# Patient Record
Sex: Male | Born: 1980 | Race: White | Hispanic: No | Marital: Married | Smoking: Former smoker
Health system: Southern US, Community
[De-identification: ages and names within clinical notes are randomized; demographics above are authoritative.]

## PROBLEM LIST (undated history)

## (undated) DIAGNOSIS — K429 Umbilical hernia without obstruction or gangrene: Secondary | ICD-10-CM

## (undated) DIAGNOSIS — F988 Other specified behavioral and emotional disorders with onset usually occurring in childhood and adolescence: Secondary | ICD-10-CM

## (undated) DIAGNOSIS — S60429A Blister (nonthermal) of unspecified finger, initial encounter: Secondary | ICD-10-CM

## (undated) DIAGNOSIS — H00019 Hordeolum externum unspecified eye, unspecified eyelid: Secondary | ICD-10-CM

## (undated) DIAGNOSIS — K219 Gastro-esophageal reflux disease without esophagitis: Secondary | ICD-10-CM

## (undated) HISTORY — PX: DENTAL SURGERY: SHX609

---

## 2003-02-24 ENCOUNTER — Encounter: Admission: RE | Admit: 2003-02-24 | Discharge: 2003-02-24 | Payer: Self-pay | Admitting: *Deleted

## 2004-08-25 ENCOUNTER — Ambulatory Visit: Payer: Self-pay | Admitting: Internal Medicine

## 2008-03-07 ENCOUNTER — Emergency Department (HOSPITAL_BASED_OUTPATIENT_CLINIC_OR_DEPARTMENT_OTHER): Admission: EM | Admit: 2008-03-07 | Discharge: 2008-03-07 | Payer: Self-pay | Admitting: Emergency Medicine

## 2010-05-31 IMAGING — CR DG KNEE COMPLETE 4+V*R*
4 series · 4 of 4 positions shown · non-contrast
Comparison: None available

CLINICAL DATA: Knee injury, medial pain

RIGHT KNEE - COMPLETE 4+ VIEW

[t knee ap right]
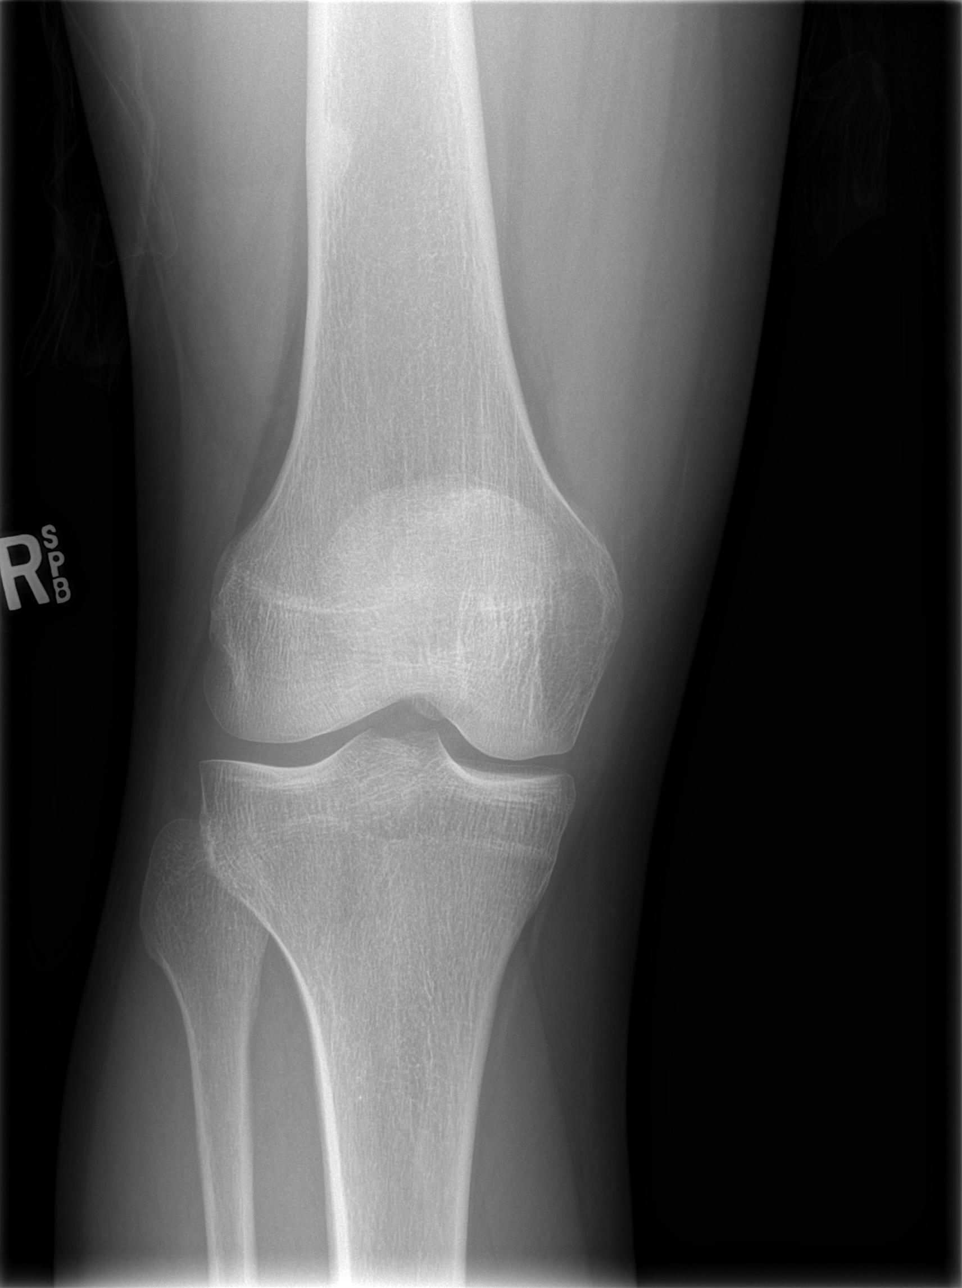

[t knee oblique right (1 of 2)]
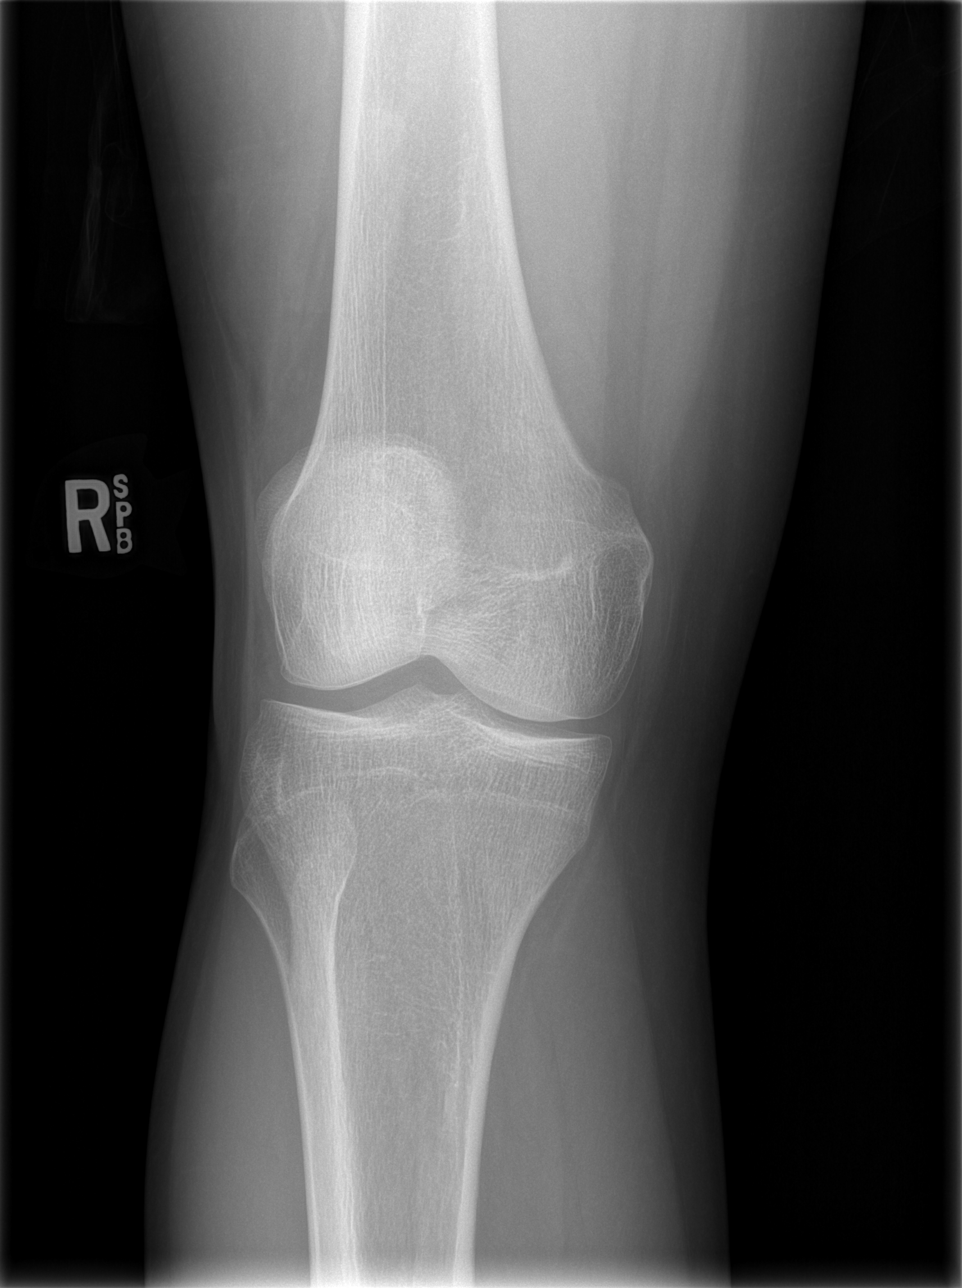

[t knee oblique right (2 of 2)]
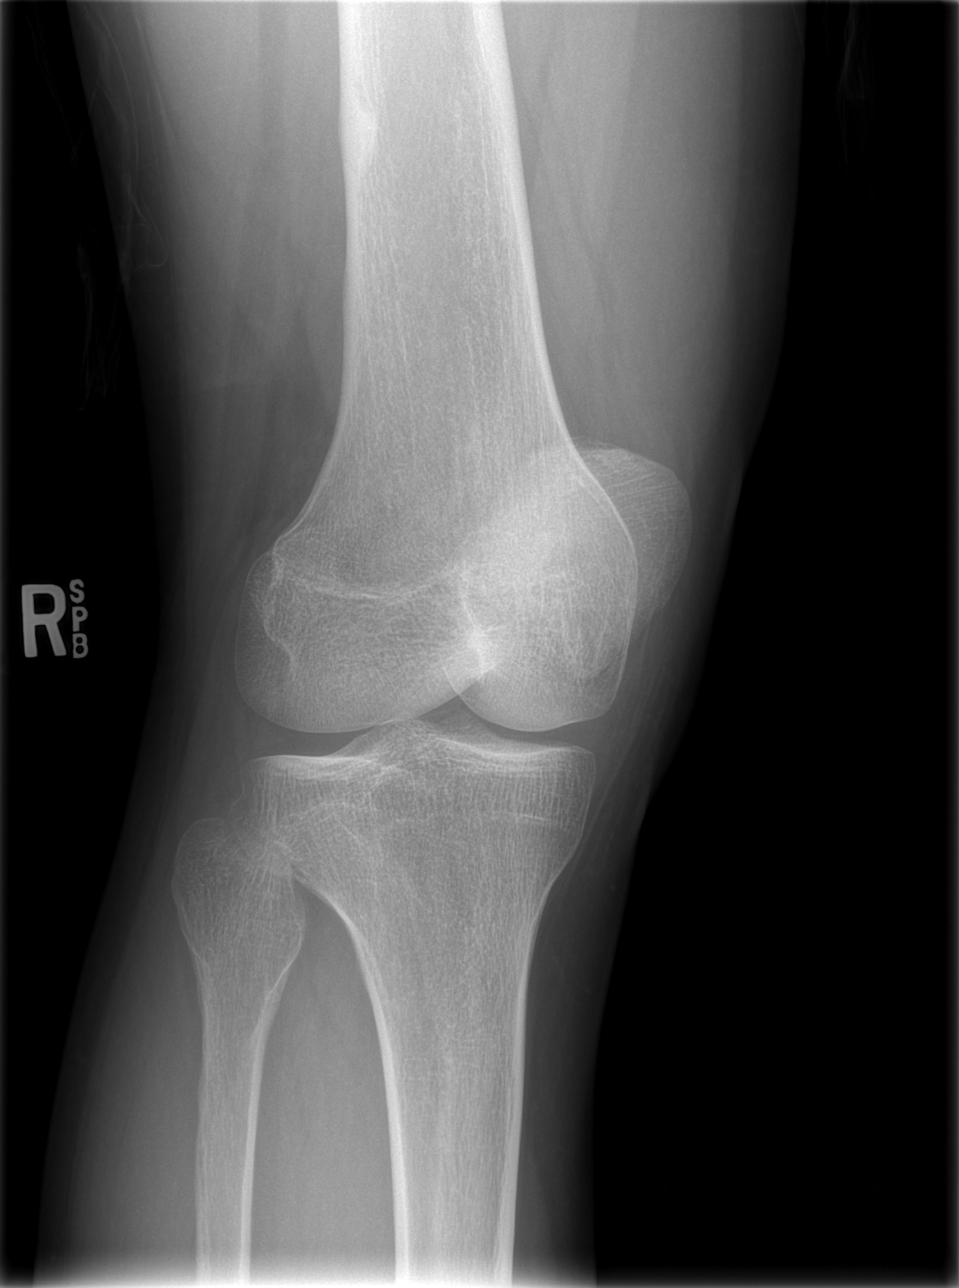

[t knee lat right]
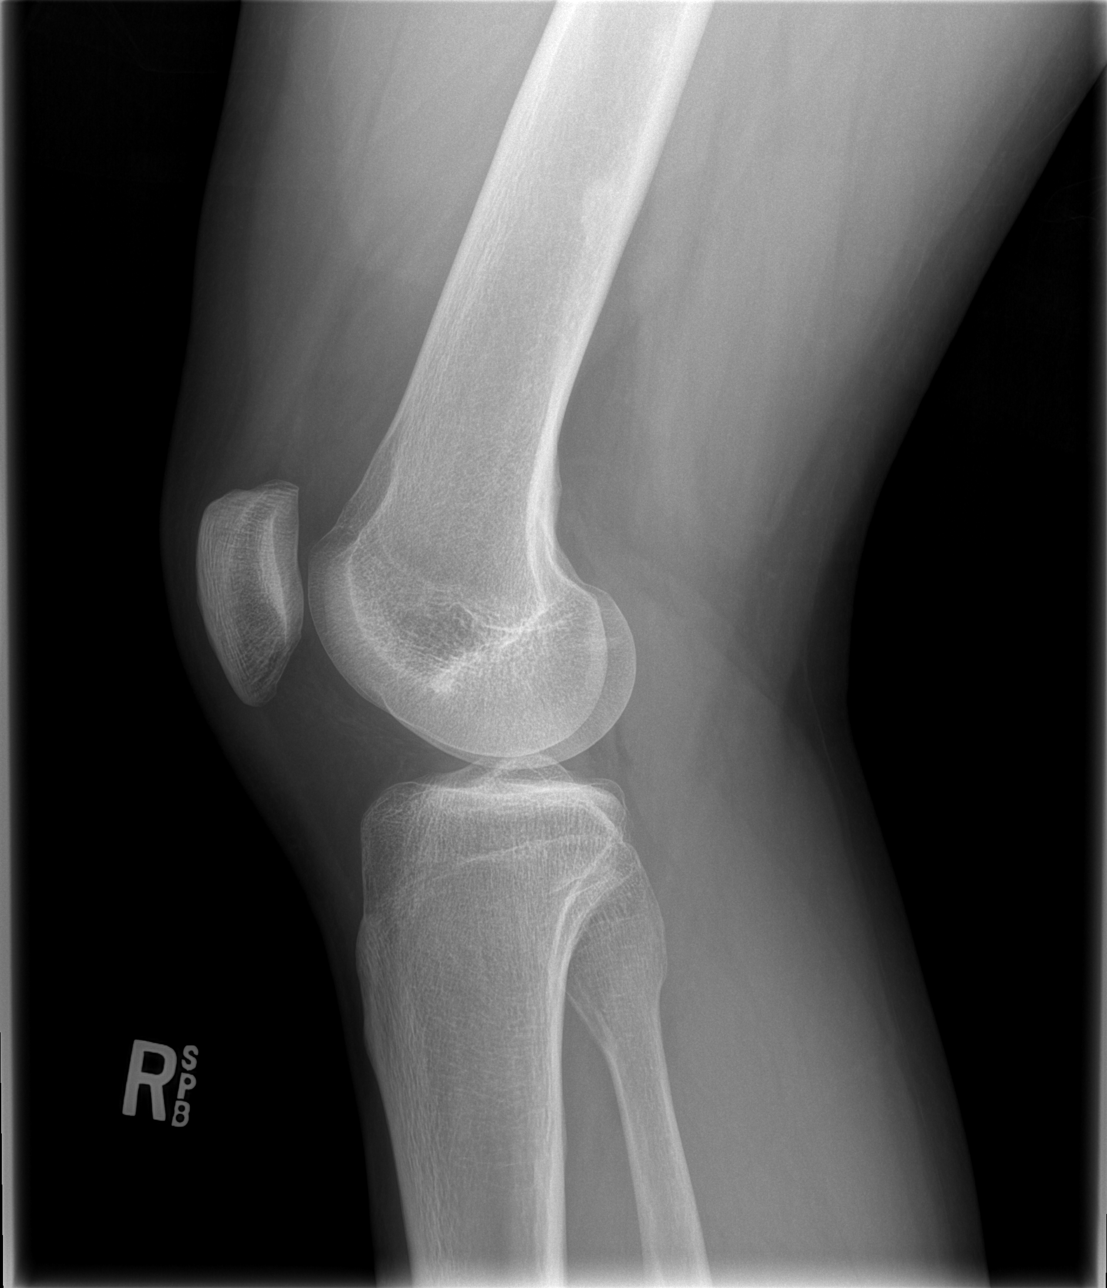

[4 of 4 positions shown; findings below may reference images not displayed]

FINDINGS: There is no acute bony or joint abnormality.  Small, old
fibrous cortical defect in the distal femur noted.  No effusion.
IMPRESSION: No acute finding.

## 2011-11-26 ENCOUNTER — Encounter (HOSPITAL_BASED_OUTPATIENT_CLINIC_OR_DEPARTMENT_OTHER): Payer: Self-pay | Admitting: *Deleted

## 2011-11-26 ENCOUNTER — Emergency Department (HOSPITAL_BASED_OUTPATIENT_CLINIC_OR_DEPARTMENT_OTHER)
Admission: EM | Admit: 2011-11-26 | Discharge: 2011-11-26 | Disposition: A | Discharge status: Home / Self Care | Payer: Self-pay | Attending: Emergency Medicine | Admitting: Emergency Medicine

## 2011-11-26 ENCOUNTER — Emergency Department (INDEPENDENT_AMBULATORY_CARE_PROVIDER_SITE_OTHER): Payer: Self-pay

## 2011-11-26 DIAGNOSIS — M25469 Effusion, unspecified knee: Secondary | ICD-10-CM | POA: Insufficient documentation

## 2011-11-26 DIAGNOSIS — X58XXXA Exposure to other specified factors, initial encounter: Secondary | ICD-10-CM | POA: Insufficient documentation

## 2011-11-26 DIAGNOSIS — M25569 Pain in unspecified knee: Secondary | ICD-10-CM

## 2011-11-26 DIAGNOSIS — S8990XA Unspecified injury of unspecified lower leg, initial encounter: Secondary | ICD-10-CM | POA: Insufficient documentation

## 2011-11-26 DIAGNOSIS — S99919A Unspecified injury of unspecified ankle, initial encounter: Secondary | ICD-10-CM | POA: Insufficient documentation

## 2011-11-26 MED ORDER — OXYCODONE-ACETAMINOPHEN 5-325 MG PO TABS
2.0000 | ORAL_TABLET | Freq: Once | ORAL | Status: AC
  Administered 2011-11-26: 2 via ORAL
  Filled 2011-11-26: qty 2

## 2011-11-26 MED ORDER — OXYCODONE-ACETAMINOPHEN 5-325 MG PO TABS: 1.0000 | ORAL_TABLET | ORAL | Status: AC | PRN

## 2011-11-26 NOTE — ED Provider Notes (Signed)
History     CSN: [id number]  Arrival date & time 11/26/11  0715   First MD Initiated Contact with Patient 11/26/11 [phone]      Chief Complaint  Patient presents with  . 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 He landed with his legs straight and on the foot. He felt that there was a popping sound at that time he had pain in his knee. During the night the pain and worsened with associated swelling. He is unable to straighten the leg completely today. He has pain with bearing weight on it. He has not had any redness but has noted some swelling around the knee. He denies a prior injury to the knee. He did not have any other injury History reviewed. No pertinent past medical history.  History reviewed. No pertinent past surgical history.  No family history on file.  History  Substance Use Topics  . Smoking status: Former Games developer  . Smokeless tobacco: Not on file  . Alcohol Use: Yes      Review of Systems  All other systems reviewed and are negative.    Allergies  Sulfa antibiotics  Home Medications  No current outpatient prescriptions on file.  BP 134/79  Pulse 92  Temp 97.9 F (36.6 C)  Resp 18  SpO2 98%  Physical Exam  Nursing note and vitals reviewed. Constitutional: He appears well-developed and well-nourished.  HENT:  Head: Normocephalic and atraumatic.  Eyes: Pupils are equal, round, and reactive to light.  Neck: Normal range of motion. Neck supple.  Musculoskeletal:       Left knee with diffuse swelling. It is slightly tender posteriorly and laterally bilaterally. No crepitus is noted. The patient is unable to completely straighten the leg but I can range of motion his knee into flexion. There is a negative drawer sign. He does not have any laxity of the lateral collateral or the medial collateral ligament. Or so pedalis  pulses are 2+ with sensation intact throughout the foot and calf.  Neurological: He is alert.  Skin: Skin is warm and dry.  Psychiatric: He has a normal mood and affect.    ED Course  Procedures (including critical care time)  Labs Reviewed - No data to display Dg Knee Complete 4 Views Left  11/26/2011  *RADIOLOGY REPORT*  Clinical Data: Left knee pain following injury.  LEFT KNEE - COMPLETE 4+ VIEW  Comparison: None  Findings: No evidence of acute fracture, subluxation or dislocation identified.  No radio-opaque foreign bodies are present.  No focal bony lesions are noted.  The joint spaces are unremarkable.  There may be a small joint effusion present.  IMPRESSION: Question small joint effusion - no evidence of bony abnormality.  Original Report Authenticated By: Rosendo Gros, M.D.     No diagnosis found.    MDM  Dg Knee Complete 4 Views Left  11/26/2011  *RADIOLOGY REPORT*  Clinical Data: Left knee pain following injury.  LEFT KNEE - COMPLETE 4+ VIEW  Comparison: None  Findings: No evidence of acute fracture, subluxation or dislocation identified.  No radio-opaque foreign bodies are present.  No focal bony lesions are noted.  The joint spaces are unremarkable.  There may be a small joint effusion present.  IMPRESSION: Question small joint effusion - no evidence of bony abnormality.  Original Report Authenticated By: Rosendo Gros, M.D.    Plan knee immobilizer, crutches, ice, and elevation, with  pain medicine. Patient is instructed to followup with Dr. Pearletha Forge this week.        Hilario Quarry, MD 11/26/11 [phone]

## 2011-11-26 NOTE — Discharge Instructions (Signed)

## 2011-11-26 NOTE — ED Notes (Signed)
Patient states that he was carrying equipment last night and had to jump off the stage and twisted his L knee and hurts to touch or ambulate, numbness running down lower leg

## 2011-11-28 ENCOUNTER — Ambulatory Visit (INDEPENDENT_AMBULATORY_CARE_PROVIDER_SITE_OTHER): Payer: Self-pay | Admitting: Family Medicine

## 2011-11-28 ENCOUNTER — Encounter: Payer: Self-pay | Admitting: Family Medicine

## 2011-11-28 VITALS — BP 130/84 | HR 80 | Temp 97.6°F | Ht 74.0 in | Wt 235.0 lb

## 2011-11-28 DIAGNOSIS — S99929A Unspecified injury of unspecified foot, initial encounter: Secondary | ICD-10-CM

## 2011-11-28 DIAGNOSIS — S8992XA Unspecified injury of left lower leg, initial encounter: Secondary | ICD-10-CM

## 2011-11-28 NOTE — Patient Instructions (Signed)
Your history and exam are most concerning for a medial meniscal tear. Your ACL, other ligaments (including one that holds kneecap in place) are intact by exam. Generally you trial conservative treatment for this which will include the following: Icing 15 minutes at a time 3-4 times a day. ACE wrap for compression to help with swelling. Aleve 2 tabs twice a day with food for pain and inflammation. Elevate above the level of your heart when possible. Quad sets and straight leg raises 3 sets of 10 once a day - add ankle weight if these become too easy. Percocet as needed for severe pain (no driving while taking this). Activities as tolerated though it will likely be 3-4 weeks before you feel like jogging, jumping. Follow up with me in 3-4 weeks before you start this. If worsening instead of improving would consider MRI, probable arthroscopy though many people live with small meniscal tears as long as they aren't locking or catching your knee.

## 2011-11-28 NOTE — Progress Notes (Signed)
  Subjective:    Patient ID: Daniel Gardner, male    DOB: 12-16-1980, 31 y.o.   MRN: [id number]  PCP: None  HPI 31 yo M here for left knee injury.  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 States he felt and heard a loud pop in left knee, able to continue but has slowly increasing swelling in knee that peaked next morning. Had negative x-rays in ED, placed in immobilizer with crutches and referred here. Has been icing, taken some percocet from ED. Unsure about catching, locking as knee has been in immobilizer. No prior injuries to left knee though has torn right MCL before and this feels similar.  History reviewed. No pertinent past medical history.  Current Outpatient Prescriptions on File Prior to Visit  Medication Sig Dispense Refill  . oxyCODONE-acetaminophen (PERCOCET) 5-325 MG per tablet Take 1 tablet by mouth every 4 (four) hours as needed for pain.  12 tablet  0    History reviewed. No pertinent past surgical history.  Allergies  Allergen Reactions  . Sulfa Antibiotics     History   Social History  . Marital Status: Married    Spouse Name: N/A    Number of Children: N/A  . Years of Education: N/A   Occupational History  . Not on file.   Social History Main Topics  . Smoking status: Former Games developer  . Smokeless tobacco: Not on file   Comment: Quit 1 and 1/2 years ago  . Alcohol Use: Yes  . Drug Use: No  . Sexually Active: Not on file   Other Topics Concern  . Not on file   Social History Narrative  . No narrative on file    Family History  Problem Relation Age of Onset  . Hypertension Mother   . Hypertension Father   . Hyperlipidemia Father   . Diabetes Father   . Heart attack Neg Hx   . Sudden death Neg Hx     BP 130/84  Pulse 80  Temp(Src) 97.6 F (36.4 C) (Oral)  Ht 6\' 2"  (1.88 m)  Wt 235 lb (106.595 kg)   BMI 30.17 kg/m2  Review of Systems See HPI above.    Objective:   Physical Exam Gen: NAD  L knee: Mild effusion.  No bruising, other deformity. TTP medial joint line.  No lateral joint line, post patellar facet or other TTP about knee. ROM 0-110 degrees. Negative ant/post drawers. Negative valgus/varus testing. Negative lachmanns. Positive mcmurrays and apleys medially.  Negative patellar apprehension, clarkes. NV intact distally.    Assessment & Plan:  1. Left knee injury - most consistent with medial meniscus tear.  No laxity with ACL or MCL testing.  Exam otherwise reassuring.  Will trial conservative care.  Rx percocet #40 q6h prn severe pain.  NSAIDs as needed for pain, inflammation, swelling.  Stop immobilizer and work on Dance movement psychotherapist. Icing, elevation.  F/u in 3-4 weeks.  Consider PT, MRI in future depending on progress.

## 2011-11-28 NOTE — Assessment & Plan Note (Signed)
most consistent with medial meniscus tear.  No laxity with ACL or MCL testing.  Exam otherwise reassuring.  Will trial conservative care.  Rx percocet #40 q6h prn severe pain.  NSAIDs as needed for pain, inflammation, swelling.  Stop immobilizer and work on Dance movement psychotherapist. Icing, elevation.  F/u in 3-4 weeks.  Consider PT, MRI in future depending on progress.

## 2011-12-04 ENCOUNTER — Ambulatory Visit: Payer: Self-pay | Admitting: Family Medicine

## 2011-12-19 ENCOUNTER — Encounter: Payer: Self-pay | Admitting: Family Medicine

## 2011-12-19 ENCOUNTER — Ambulatory Visit (INDEPENDENT_AMBULATORY_CARE_PROVIDER_SITE_OTHER): Payer: Self-pay | Admitting: Family Medicine

## 2011-12-19 VITALS — BP 142/90 | HR 94 | Temp 98.1°F | Ht 73.0 in | Wt 235.0 lb

## 2011-12-19 DIAGNOSIS — S8990XA Unspecified injury of unspecified lower leg, initial encounter: Secondary | ICD-10-CM

## 2011-12-19 DIAGNOSIS — S8992XA Unspecified injury of left lower leg, initial encounter: Secondary | ICD-10-CM

## 2011-12-21 ENCOUNTER — Encounter: Payer: Self-pay | Admitting: Family Medicine

## 2011-12-21 NOTE — Progress Notes (Signed)
  Subjective:    Patient ID: Daniel Gardner, male    DOB: 1981/07/30, 31 y.o.   MRN: [id number]  PCP: None  HPI  31 yo M here for f/u left knee injury.  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 States he felt and heard a loud pop in left knee, able to continue but has slowly increasing swelling in knee that peaked next morning. Had negative x-rays in ED, placed in immobilizer with crutches and referred here. Has been icing, taken some percocet from ED. Unsure about catching, locking as knee has been in immobilizer. No prior injuries to left knee though has torn right MCL before and this feels similar.  5/7: Patient states overall he is feeling much better. Does get pain that comes and goes mostly medial. Has been doing home quad strengthening exercises. No longer taking percocet - taking aleve. Still feels like knee will occasionally give out. No catching or locking. Happy with his improvement to date.  History reviewed. No pertinent past medical history.  No current outpatient prescriptions on file prior to visit.    History reviewed. No pertinent past surgical history.  Allergies  Allergen Reactions  . Sulfa Antibiotics     History   Social History  . Marital Status: Married    Spouse Name: N/A    Number of Children: N/A  . Years of Education: N/A   Occupational History  . Not on file.   Social History Main Topics  . Smoking status: Former Games developer  . Smokeless tobacco: Not on file   Comment: Quit 1 and 1/2 years ago  . Alcohol Use: Yes  . Drug Use: No  . Sexually Active: Not on file   Other Topics Concern  . Not on file   Social History Narrative  . No narrative on file    Family History  Problem Relation Age of Onset  . Hypertension Mother   . Hypertension Father   . Hyperlipidemia Father   . Diabetes  Father   . Heart attack Neg Hx   . Sudden death Neg Hx     BP 142/90  Pulse 94  Temp(Src) 98.1 F (36.7 C) (Oral)  Ht 6\' 1"  (1.854 m)  Wt 235 lb (106.595 kg)  BMI 31.00 kg/m2  Review of Systems  See HPI above.    Objective:   Physical Exam  Gen: NAD  L knee: No effusion.  No bruising, other deformity. TTP medial joint line.  No lateral joint line, post patellar facet or other TTP about knee. FROM - improved. Negative ant/post drawers. Negative valgus/varus testing. Negative lachmanns. Mildly positive mcmurrays and apleys medially.  Negative patellar apprehension, clarkes. NV intact distally.    Assessment & Plan:  1. Left knee injury - He is clinically improving though not completely.  Exam still concerning for a medial meniscus tear - not locking or catching and with his improvement, he'd like to continue with conservative care.  Discussed formal PT but will defer this for now.  Discussed if he plateaus in recovery to return for reevaluation as we would likely move forward with MRI or possible one time cortisone injection to see if we could calm down the inflammation, pain from meniscal tear.  If doesn't improve would then consider arthroscopic debridement.  F/u prn.

## 2011-12-21 NOTE — Assessment & Plan Note (Signed)
He is clinically improving though not completely.  Exam still concerning for a medial meniscus tear - not locking or catching and with his improvement, he'd like to continue with conservative care.  Discussed formal PT but will defer this for now.  Discussed if he plateaus in recovery to return for reevaluation as we would likely move forward with MRI or possible one time cortisone injection to see if we could calm down the inflammation, pain from meniscal tear.  If doesn't improve would then consider arthroscopic debridement.  F/u prn.

## 2012-07-03 ENCOUNTER — Encounter: Payer: Self-pay | Admitting: Internal Medicine

## 2012-07-03 ENCOUNTER — Ambulatory Visit (INDEPENDENT_AMBULATORY_CARE_PROVIDER_SITE_OTHER): Payer: Self-pay | Admitting: Internal Medicine

## 2012-07-03 VITALS — BP 126/80 | HR 72 | Temp 97.4°F | Ht 72.5 in | Wt 272.0 lb

## 2012-07-03 DIAGNOSIS — E669 Obesity, unspecified: Secondary | ICD-10-CM | POA: Insufficient documentation

## 2012-07-03 DIAGNOSIS — F988 Other specified behavioral and emotional disorders with onset usually occurring in childhood and adolescence: Secondary | ICD-10-CM | POA: Insufficient documentation

## 2012-07-03 DIAGNOSIS — Z0000 Encounter for general adult medical examination without abnormal findings: Secondary | ICD-10-CM

## 2012-07-03 LAB — CBC WITH DIFFERENTIAL/PLATELET
Basophils Relative: 0.4 % (ref 0.0–3.0)
Eosinophils Absolute: 0 10*3/uL (ref 0.0–0.7)
Lymphs Abs: 2.6 10*3/uL (ref 0.7–4.0)
MCHC: 33.8 g/dL (ref 30.0–36.0)
MCV: 96.6 fl (ref 78.0–100.0)
Monocytes Absolute: 0.8 10*3/uL (ref 0.1–1.0)
Neutrophils Relative %: 57.6 % (ref 43.0–77.0)
RBC: 4.57 Mil/uL (ref 4.22–5.81)

## 2012-07-03 LAB — BASIC METABOLIC PANEL
BUN: 16 mg/dL (ref 6–23)
CO2: 29 mEq/L (ref 19–32)
Chloride: 99 mEq/L (ref 96–112)
Creatinine, Ser: 1.1 mg/dL (ref 0.4–1.5)

## 2012-07-03 LAB — LIPID PANEL
Cholesterol: 230 mg/dL — ABNORMAL HIGH (ref 0–200)
HDL: 43.2 mg/dL (ref >39.00)
Triglycerides: 348 mg/dL — ABNORMAL HIGH (ref 0.0–149.0)

## 2012-07-03 LAB — TSH: TSH: 1.38 u[IU]/mL (ref 0.35–5.50)

## 2012-07-03 LAB — HEPATIC FUNCTION PANEL
Albumin: 4.2 g/dL (ref 3.5–5.2)
Bilirubin, Direct: 0.1 mg/dL (ref 0.0–0.3)
Total Protein: 7.5 g/dL (ref 6.0–8.3)

## 2012-07-03 MED ORDER — AMPHETAMINE-DEXTROAMPHET ER 20 MG PO CP24: 20.0000 mg | ORAL_CAPSULE | ORAL | Status: DC

## 2012-07-03 NOTE — Assessment & Plan Note (Addendum)
31 year old with history of ADD having difficulty multitasking and keeping organized at work.  Start Adderall XR 20 mg.  We discussed potential side effects. Patient to monitor blood pressure at home.  Reassess in 1 month.

## 2012-07-03 NOTE — Progress Notes (Signed)
Subjective:    Patient ID: HILDRED MOLLICA, male    DOB: 01-Dec-1980, 31 y.o.   MRN: [id number]  HPI  31 year old white male with history of ADD to establish. Patient reports he was diagnosed with ADD in childhood. He took Ritalin from age [age]. Patient reports remembering Ritalin made him feel sluggish. Since then he has not been on any medications for ADD.  He recently got married a year ago and is currently employed in Airline [employer]. He is having difficulty with short-term memory and staying on task. He has difficulty multitasking.  He has family history of mild type 2 diabetes and hypertension. Since he got married patient reports significant weight gain. He has gained approximately 50 pounds. He attributed to weight gain to traveling and eating on the road.   Review of Systems  Constitutional: Negative for activity change, appetite change   Eyes: Negative for visual disturbance.  Respiratory: Negative for cough, chest tightness and shortness of breath.   Cardiovascular: Negative for chest pain.  Genitourinary: Negative for difficulty urinating.  Neurological: Negative for headaches.  Gastrointestinal: Negative for abdominal pain, heartburn melena or hematochezia Psych: Negative for depression or anxiety ID - Negative for history of STDs  Past Medical History  Diagnosis Date  . History of chickenpox     History   Social History  . Marital Status: Married    Spouse Name: N/A    Number of Children: N/A  . Years of Education: N/A   Occupational History  . Not on file.   Social History Main Topics  . Smoking status: Former Games developer  . Smokeless tobacco: Not on file     Comment: Quit 1 and 1/2 years ago  . Alcohol Use: Yes  . Drug Use: No  . Sexually Active: Not on file   Other Topics Concern  . Not on file   Social History Narrative  . No narrative on file    Past Surgical History  Procedure Date  . Mouth surgery     Family History  Problem Relation Age of Onset   . Hypertension Mother   . Hypertension Father   . Hyperlipidemia Father   . Diabetes Father   . Heart attack Neg Hx   . Sudden death Neg Hx   . Alcohol abuse    . Arthritis    . Cancer      colon,    Allergies  Allergen Reactions  . Sulfa Antibiotics     Current Outpatient Prescriptions on File Prior to Visit  Medication Sig Dispense Refill  . amphetamine-dextroamphetamine (ADDERALL XR) 20 MG 24 hr capsule Take 1 capsule (20 mg total) by mouth every morning.  30 capsule  0    BP 126/80  Pulse 72  Temp 97.4 F (36.3 C) (Oral)  Ht 6' 0.5" (1.842 m)  Wt 272 lb (123.378 kg)  BMI 36.38 kg/m2           Objective:   Physical Exam  Constitutional: He is oriented to person, place, and time.       Pleasant, overweight 31 y/o male  HENT:  Head: Normocephalic and atraumatic.  Right Ear: External ear normal.  Left Ear: External ear normal.  Mouth/Throat: Oropharynx is clear and moist.  Eyes: Conjunctivae normal and EOM are normal. Pupils are equal, round, and reactive to light.  Neck: Neck supple.  Cardiovascular: Normal rate, regular rhythm, normal heart sounds and intact distal pulses.   No murmur heard. Pulmonary/Chest: Effort normal and breath sounds normal.  He has no wheezes.  Abdominal: Soft. Bowel sounds are normal. He exhibits no mass.       Small umbilical hernia   Genitourinary: Penis normal.       Testicular exam normal  Musculoskeletal: Normal range of motion. He exhibits no edema.  Lymphadenopathy:    He has no cervical adenopathy.  Neurological: He is alert and oriented to person, place, and time.  Skin: Skin is warm and dry.  Psychiatric: He has a normal mood and affect. His behavior is normal.          Assessment & Plan:

## 2012-07-03 NOTE — Assessment & Plan Note (Signed)
I advised 10% of body weight loss within next 6 months. Weight loss strategies discussed.  Check thyroid studies and screen for diabetes.

## 2012-07-30 ENCOUNTER — Encounter: Payer: Self-pay | Admitting: Internal Medicine

## 2012-07-30 ENCOUNTER — Ambulatory Visit (INDEPENDENT_AMBULATORY_CARE_PROVIDER_SITE_OTHER): Payer: No Typology Code available for payment source | Admitting: Internal Medicine

## 2012-07-30 VITALS — BP 132/82 | HR 84 | Temp 97.9°F | Wt 263.0 lb

## 2012-07-30 DIAGNOSIS — F988 Other specified behavioral and emotional disorders with onset usually occurring in childhood and adolescence: Secondary | ICD-10-CM

## 2012-07-30 MED ORDER — AMPHETAMINE-DEXTROAMPHET ER 20 MG PO CP24: 20.0000 mg | ORAL_CAPSULE | ORAL | Status: DC

## 2012-07-30 NOTE — Assessment & Plan Note (Signed)
Patient has noticed significant improvement in his ability to focus since starting Adderall XR. However he has also experienced significant change in appetite. Patient agrees to continue Adderall XR for one more month. If appetite changes are persistent then he has difficulty maintaining his weight we discussed possibly switching to Vyvanse.

## 2012-07-30 NOTE — Progress Notes (Signed)
  Subjective:    Patient ID: Daniel Gardner, male    DOB: 1981/01/29, 31 y.o.   MRN: [id number]  HPI  31 year old white male for followup regarding ADD. Patient started on Adderall XR 20 mg once daily. He takes his medication approximately between 8 and 9 AM. He has noticed a significant decrease in his appetite since starting medication. He has lost approximately 10 pounds. However he has also been trying to eat healthier and has decreased his carbohydrate intake in general. His family/wife has not noticed any significant changes in mood. He denies any sleep disturbances. He has been try to eat regular meals despite change in appetite.  Review of Systems Negative for chest pain or irregular heartbeats    Past Medical History  Diagnosis Date  . History of chickenpox     History   Social History  . Marital Status: Married    Spouse Name: N/A    Number of Children: N/A  . Years of Education: N/A   Occupational History  . Not on file.   Social History Main Topics  . Smoking status: Former Games developer  . Smokeless tobacco: Not on file     Comment: Quit 1 and 1/2 years ago  . Alcohol Use: Yes  . Drug Use: No  . Sexually Active: Not on file   Other Topics Concern  . Not on file   Social History Narrative  . No narrative on file    Past Surgical History  Procedure Date  . Mouth surgery     Family History  Problem Relation Age of Onset  . Hypertension Mother   . Hypertension Father   . Hyperlipidemia Father   . Diabetes Father   . Heart attack Neg Hx   . Sudden death Neg Hx   . Alcohol abuse    . Arthritis    . Cancer      colon,    Allergies  Allergen Reactions  . Sulfa Antibiotics     Current Outpatient Prescriptions on File Prior to Visit  Medication Sig Dispense Refill  . amphetamine-dextroamphetamine (ADDERALL XR) 20 MG 24 hr capsule Take 1 capsule (20 mg total) by mouth every morning.  30 capsule  0  . ibuprofen (ADVIL,MOTRIN) 200 MG tablet Take 200  mg by mouth every 6 (six) hours as needed.        BP 132/82  Pulse 84  Temp 97.9 F (36.6 C) (Oral)  Wt 263 lb (119.296 kg)    Objective:   Physical Exam  Constitutional: He is oriented to person, place, and time. He appears well-developed and well-nourished.  Cardiovascular: Normal rate, regular rhythm and normal heart sounds.   Pulmonary/Chest: Effort normal and breath sounds normal. He has no wheezes.  Neurological: He is alert and oriented to person, place, and time. No cranial nerve deficit.          Assessment & Plan:

## 2012-08-09 ENCOUNTER — Ambulatory Visit: Payer: Self-pay | Admitting: Internal Medicine

## 2012-08-14 DIAGNOSIS — K429 Umbilical hernia without obstruction or gangrene: Secondary | ICD-10-CM

## 2012-08-14 HISTORY — DX: Umbilical hernia without obstruction or gangrene: K42.9

## 2012-08-21 ENCOUNTER — Telehealth: Payer: Self-pay | Admitting: Internal Medicine

## 2012-08-21 DIAGNOSIS — K429 Umbilical hernia without obstruction or gangrene: Secondary | ICD-10-CM

## 2012-08-21 NOTE — Telephone Encounter (Signed)
Refer to Dr. Harlon Flor.

## 2012-08-21 NOTE — Telephone Encounter (Signed)
Referral order placed, pt aware 

## 2012-08-21 NOTE — Telephone Encounter (Signed)
Pt was in for cpx Nov 2013 and you discovered umbilical hernia. He said he was to call us if it worsened. Pt states it is much worse in the last 2 days. He is wondering what to do from here - OV, referral, etc. Please advise. Thanks.

## 2012-08-23 ENCOUNTER — Encounter (INDEPENDENT_AMBULATORY_CARE_PROVIDER_SITE_OTHER): Payer: Self-pay | Admitting: Surgery

## 2012-08-23 ENCOUNTER — Ambulatory Visit (INDEPENDENT_AMBULATORY_CARE_PROVIDER_SITE_OTHER): Payer: No Typology Code available for payment source | Admitting: Surgery

## 2012-08-23 VITALS — BP 110/72 | HR 72 | Temp 97.2°F | Resp 18 | Ht 74.0 in | Wt 263.0 lb

## 2012-08-23 DIAGNOSIS — K429 Umbilical hernia without obstruction or gangrene: Secondary | ICD-10-CM

## 2012-08-23 NOTE — Progress Notes (Signed)
Patient ID: Daniel Gardner, male   DOB: 12/17/1980, 32 y.o.   MRN: [id number]  Chief Complaint  Patient presents with  . New Evaluation    umb hernia    HPI Daniel Gardner is a 32 y.o. male.   HPI This is a healthy 32-year-old male who has noticed a small bulge at his umbilicus for a couple of years. This has become larger and is now more uncomfortable. He denies any obstructive symptoms. He has lost a considerable amount of weight recently with diet and exercise. No previous surgery.  Past Medical History  Diagnosis Date  . History of chickenpox     Past Surgical History  Procedure Date  . Mouth surgery     Family History  Problem Relation Age of Onset  . Hypertension Mother   . Hypertension Father   . Hyperlipidemia Father   . Diabetes Father   . Heart attack Neg Hx   . Sudden death Neg Hx   . Alcohol abuse    . Arthritis    . Cancer      colon,  . Cancer Maternal Grandmother     Colon  . Cancer Maternal Grandfather     Colon    Social History History  Substance Use Topics  . Smoking status: Former Smoker    Types: Cigarettes    Quit date: 08/14/2010  . Smokeless tobacco: Not on file     Comment: Quit 1 and 1/2 years ago  . Alcohol Use: Yes     Comment: social    Allergies  Allergen Reactions  . Sulfa Antibiotics     Current Outpatient Prescriptions  Medication Sig Dispense Refill  . amphetamine-dextroamphetamine (ADDERALL XR) 20 MG 24 hr capsule Take 1 capsule (20 mg total) by mouth every morning.  30 capsule  0  . ibuprofen (ADVIL,MOTRIN) 200 MG tablet Take 200 mg by mouth every 6 (six) hours as needed.        Review of Systems Review of Systems  Constitutional: Negative for fever, chills and unexpected weight change.  HENT: Negative for hearing loss, congestion, sore throat, trouble swallowing and voice change.   Eyes: Negative for visual disturbance.  Respiratory: Negative for cough and wheezing.   Cardiovascular: Negative for chest  pain, palpitations and leg swelling.  Gastrointestinal: Positive for abdominal pain. Negative for nausea, vomiting, diarrhea, constipation, blood in stool, abdominal distention, anal bleeding and rectal pain.  Genitourinary: Negative for hematuria and difficulty urinating.  Musculoskeletal: Negative for arthralgias.  Skin: Negative for rash and wound.  Neurological: Negative for seizures, syncope, weakness and headaches.  Hematological: Negative for adenopathy. Does not bruise/bleed easily.  Psychiatric/Behavioral: Negative for confusion.    Blood pressure 110/72, pulse 72, temperature 97.2 F (36.2 C), temperature source Temporal, resp. rate 18, height 6' 2" (1.88 m), weight 263 lb (119.296 kg).  Physical Exam Physical Exam WDWN in NAD HEENT:  EOMI, sclera anicteric Neck:  No masses, no thyromegaly Lungs:  CTA bilaterally; normal respiratory effort CV:  Regular rate and rhythm; no murmurs Abd:  +bowel sounds, soft, visible bulge in left upper umbilicus; enlarges with Valsalva; not reducible Ext:  Well-perfused; no edema Skin:  Warm, dry; no sign of jaundice  Data Reviewed none  Assessment    Umbilical hernia - not reducible; defect is probably very small    Plan    Umbilical hernia repair, possibly with mesh.  The surgical procedure has been discussed with the patient.  Potential risks, benefits, alternative treatments, and   expected outcomes have been explained.  All of the patient's questions at this time have been answered.  The likelihood of reaching the patient's treatment goal is good.  The patient understand the proposed surgical procedure and wishes to proceed.        Jefry Lesinski K. 08/23/2012, 12:09 PM    

## 2012-08-29 ENCOUNTER — Ambulatory Visit (INDEPENDENT_AMBULATORY_CARE_PROVIDER_SITE_OTHER): Payer: No Typology Code available for payment source | Admitting: Internal Medicine

## 2012-08-29 ENCOUNTER — Encounter: Payer: Self-pay | Admitting: Internal Medicine

## 2012-08-29 ENCOUNTER — Encounter (HOSPITAL_BASED_OUTPATIENT_CLINIC_OR_DEPARTMENT_OTHER): Payer: Self-pay | Admitting: *Deleted

## 2012-08-29 VITALS — BP 114/82 | HR 76 | Temp 97.9°F | Wt 261.0 lb

## 2012-08-29 DIAGNOSIS — S60429A Blister (nonthermal) of unspecified finger, initial encounter: Secondary | ICD-10-CM

## 2012-08-29 DIAGNOSIS — H00019 Hordeolum externum unspecified eye, unspecified eyelid: Secondary | ICD-10-CM

## 2012-08-29 DIAGNOSIS — F988 Other specified behavioral and emotional disorders with onset usually occurring in childhood and adolescence: Secondary | ICD-10-CM

## 2012-08-29 DIAGNOSIS — K429 Umbilical hernia without obstruction or gangrene: Secondary | ICD-10-CM

## 2012-08-29 HISTORY — DX: Hordeolum externum unspecified eye, unspecified eyelid: H00.019

## 2012-08-29 HISTORY — DX: Blister (nonthermal) of unspecified finger, initial encounter: S60.429A

## 2012-08-29 MED ORDER — AMPHETAMINE-DEXTROAMPHET ER 20 MG PO CP24: 20.0000 mg | ORAL_CAPSULE | ORAL | Status: DC

## 2012-08-29 NOTE — Assessment & Plan Note (Signed)
Patient seen by general surgeon for symptomatic small umbilical hernia. Hernia is nonreducible. Surgical repair planned on 09/05/2012.

## 2012-08-29 NOTE — Assessment & Plan Note (Signed)
Anorexia symptoms improved. Continue Adderall XR 20 mg once daily. Reassess in 3 months

## 2012-08-29 NOTE — Progress Notes (Signed)
  Subjective:    Patient ID: Daniel Gardner, male    DOB: 1980/08/28, 32 y.o.   MRN: [id number]  HPI  32 year old white male with history of ADD for followup. Patient reports appetite is improved since previous visit. He is "getting use to medication".  He make conscious effort to eat 3 meals.  His weight is stable. He denies any mood changes. His sleep has actually improved. He feels medication wears off by approximately 8 to 9:00 PM.  Interval medical history-he has umbilical hernia that has been bothering him more recently. He was seen by general surgeon Dr. Harlon Flor. Umbilical hernia repair is planned in 09/05/2012.  Review of Systems Negative for chest pain, negative for palpitations  Past Medical History  Diagnosis Date  . History of chickenpox     History   Social History  . Marital Status: Married    Spouse Name: N/A    Number of Children: N/A  . Years of Education: N/A   Occupational History  . Not on file.   Social History Main Topics  . Smoking status: Former Smoker    Types: Cigarettes    Quit date: 08/14/2010  . Smokeless tobacco: Not on file     Comment: Quit 1 and 1/2 years ago  . Alcohol Use: Yes     Comment: social  . Drug Use: No  . Sexually Active: Not on file   Other Topics Concern  . Not on file   Social History Narrative  . No narrative on file    Past Surgical History  Procedure Date  . Mouth surgery     Family History  Problem Relation Age of Onset  . Hypertension Mother   . Hypertension Father   . Hyperlipidemia Father   . Diabetes Father   . Heart attack Neg Hx   . Sudden death Neg Hx   . Alcohol abuse    . Arthritis    . Cancer      colon,  . Cancer Maternal Grandmother     Colon  . Cancer Maternal Grandfather     Colon    Allergies  Allergen Reactions  . Sulfa Antibiotics     Current Outpatient Prescriptions on File Prior to Visit  Medication Sig Dispense Refill  . amphetamine-dextroamphetamine (ADDERALL XR) 20  MG 24 hr capsule Take 1 capsule (20 mg total) by mouth every morning.  30 capsule  0  . ibuprofen (ADVIL,MOTRIN) 200 MG tablet Take 200 mg by mouth every 6 (six) hours as needed.        BP 114/82  Pulse 76  Temp 97.9 F (36.6 C) (Oral)  Wt 261 lb (118.389 kg)       Objective:   Physical Exam  Constitutional: He appears well-developed and well-nourished.  Cardiovascular: Normal rate, regular rhythm and normal heart sounds.   Pulmonary/Chest: Effort normal and breath sounds normal. He has no wheezes.  Abdominal: Soft. Bowel sounds are normal.       Abdominal obesity, small non reducible umbilical hernia.          Assessment & Plan:

## 2012-09-05 ENCOUNTER — Encounter (HOSPITAL_BASED_OUTPATIENT_CLINIC_OR_DEPARTMENT_OTHER): Payer: Self-pay | Admitting: *Deleted

## 2012-09-05 ENCOUNTER — Ambulatory Visit (HOSPITAL_BASED_OUTPATIENT_CLINIC_OR_DEPARTMENT_OTHER)
Admission: RE | Admit: 2012-09-05 | Discharge: 2012-09-05 | Disposition: A | Discharge status: Home / Self Care | Payer: No Typology Code available for payment source | Source: Ambulatory Visit | Attending: Surgery | Admitting: Surgery

## 2012-09-05 ENCOUNTER — Encounter (HOSPITAL_BASED_OUTPATIENT_CLINIC_OR_DEPARTMENT_OTHER): Payer: Self-pay | Admitting: Certified Registered"

## 2012-09-05 ENCOUNTER — Encounter (HOSPITAL_BASED_OUTPATIENT_CLINIC_OR_DEPARTMENT_OTHER)
Admission: RE | Disposition: A | Discharge status: Home / Self Care | Payer: Self-pay | Source: Ambulatory Visit | Attending: Surgery

## 2012-09-05 ENCOUNTER — Ambulatory Visit (HOSPITAL_BASED_OUTPATIENT_CLINIC_OR_DEPARTMENT_OTHER): Payer: No Typology Code available for payment source | Admitting: Certified Registered"

## 2012-09-05 DIAGNOSIS — K429 Umbilical hernia without obstruction or gangrene: Secondary | ICD-10-CM | POA: Insufficient documentation

## 2012-09-05 HISTORY — DX: Other specified behavioral and emotional disorders with onset usually occurring in childhood and adolescence: F98.8

## 2012-09-05 HISTORY — PX: UMBILICAL HERNIA REPAIR: SHX196

## 2012-09-05 HISTORY — DX: Gastro-esophageal reflux disease without esophagitis: K21.9

## 2012-09-05 HISTORY — DX: Hordeolum externum unspecified eye, unspecified eyelid: H00.019

## 2012-09-05 HISTORY — DX: Blister (nonthermal) of unspecified finger, initial encounter: S60.429A

## 2012-09-05 HISTORY — DX: Umbilical hernia without obstruction or gangrene: K42.9

## 2012-09-05 SURGERY — REPAIR, HERNIA, UMBILICAL, ADULT
Anesthesia: General | Site: Abdomen | Wound class: Clean

## 2012-09-05 MED ORDER — CEFAZOLIN SODIUM-DEXTROSE 2-3 GM-% IV SOLR
2.0000 g | INTRAVENOUS | Status: AC
  Administered 2012-09-05: 2 g via INTRAVENOUS

## 2012-09-05 MED ORDER — MIDAZOLAM HCL 2 MG/2ML IJ SOLN: 1.0000 mg | INTRAMUSCULAR | Status: DC | PRN

## 2012-09-05 MED ORDER — FENTANYL CITRATE 0.05 MG/ML IJ SOLN: 50.0000 ug | INTRAMUSCULAR | Status: DC | PRN

## 2012-09-05 MED ORDER — BUPIVACAINE-EPINEPHRINE 0.25% -1:200000 IJ SOLN
INTRAMUSCULAR | Status: DC | PRN
  Administered 2012-09-05: 10 mL

## 2012-09-05 MED ORDER — PROPOFOL 10 MG/ML IV BOLUS
INTRAVENOUS | Status: DC | PRN
  Administered 2012-09-05: 270 mg via INTRAVENOUS

## 2012-09-05 MED ORDER — OXYCODONE HCL 5 MG PO TABS
5.0000 mg | ORAL_TABLET | Freq: Once | ORAL | Status: AC | PRN
  Administered 2012-09-05: 5 mg via ORAL

## 2012-09-05 MED ORDER — OXYCODONE HCL 5 MG/5ML PO SOLN: 5.0000 mg | Freq: Once | ORAL | Status: AC | PRN

## 2012-09-05 MED ORDER — FENTANYL CITRATE 0.05 MG/ML IJ SOLN
INTRAMUSCULAR | Status: DC | PRN
  Administered 2012-09-05: 25 ug via INTRAVENOUS
  Administered 2012-09-05: 100 ug via INTRAVENOUS
  Administered 2012-09-05: 50 ug via INTRAVENOUS

## 2012-09-05 MED ORDER — LACTATED RINGERS IV SOLN
INTRAVENOUS | Status: DC
  Administered 2012-09-05 (×2): via INTRAVENOUS

## 2012-09-05 MED ORDER — LIDOCAINE HCL (CARDIAC) 20 MG/ML IV SOLN
INTRAVENOUS | Status: DC | PRN
  Administered 2012-09-05: 90 mg via INTRAVENOUS

## 2012-09-05 MED ORDER — ONDANSETRON HCL 4 MG/2ML IJ SOLN
INTRAMUSCULAR | Status: DC | PRN
  Administered 2012-09-05: 4 mg via INTRAVENOUS

## 2012-09-05 MED ORDER — HYDROMORPHONE HCL PF 1 MG/ML IJ SOLN
0.2500 mg | INTRAMUSCULAR | Status: DC | PRN
  Administered 2012-09-05 (×3): 0.5 mg via INTRAVENOUS

## 2012-09-05 MED ORDER — OXYCODONE-ACETAMINOPHEN 5-325 MG PO TABS: 1.0000 | ORAL_TABLET | ORAL | Status: DC | PRN

## 2012-09-05 MED ORDER — MORPHINE SULFATE 2 MG/ML IJ SOLN: 2.0000 mg | INTRAMUSCULAR | Status: DC | PRN

## 2012-09-05 MED ORDER — ACETAMINOPHEN 10 MG/ML IV SOLN
INTRAVENOUS | Status: DC | PRN
  Administered 2012-09-05: 1000 mg via INTRAVENOUS

## 2012-09-05 MED ORDER — CHLORHEXIDINE GLUCONATE 4 % EX LIQD: 1.0000 "application " | Freq: Once | CUTANEOUS | Status: DC

## 2012-09-05 MED ORDER — OXYCODONE-ACETAMINOPHEN 5-325 MG PO TABS
1.0000 | ORAL_TABLET | ORAL | Status: DC | PRN
Start: 2012-09-05 — End: 2012-09-05

## 2012-09-05 MED ORDER — PROPOFOL 10 MG/ML IV BOLUS: INTRAVENOUS | Status: DC | PRN

## 2012-09-05 MED ORDER — MIDAZOLAM HCL 5 MG/5ML IJ SOLN
INTRAMUSCULAR | Status: DC | PRN
  Administered 2012-09-05: 2 mg via INTRAVENOUS

## 2012-09-05 MED ORDER — ONDANSETRON HCL 4 MG/2ML IJ SOLN: 4.0000 mg | INTRAMUSCULAR | Status: DC | PRN

## 2012-09-05 MED ORDER — DEXAMETHASONE SODIUM PHOSPHATE 4 MG/ML IJ SOLN
INTRAMUSCULAR | Status: DC | PRN
  Administered 2012-09-05: 10 mg via INTRAVENOUS

## 2012-09-05 MED ORDER — SUCCINYLCHOLINE CHLORIDE 20 MG/ML IJ SOLN
INTRAMUSCULAR | Status: DC | PRN
  Administered 2012-09-05: 100 mg via INTRAVENOUS

## 2012-09-05 SURGICAL SUPPLY — 59 items
APPLICATOR COTTON TIP 6IN STRL (MISCELLANEOUS) IMPLANT
BENZOIN TINCTURE PRP APPL 2/3 (GAUZE/BANDAGES/DRESSINGS) ×2 IMPLANT
BLADE HEX COATED 2.75 (ELECTRODE) ×2 IMPLANT
BLADE SURG 15 STRL LF DISP TIS (BLADE) ×1 IMPLANT
BLADE SURG 15 STRL SS (BLADE) ×1
BLADE SURG ROTATE 9660 (MISCELLANEOUS) ×2 IMPLANT
CANISTER SUCTION 1200CC (MISCELLANEOUS) IMPLANT
CHLORAPREP W/TINT 26ML (MISCELLANEOUS) ×2 IMPLANT
CLOTH BEACON ORANGE TIMEOUT ST (SAFETY) ×2 IMPLANT
COVER MAYO STAND STRL (DRAPES) ×2 IMPLANT
COVER TABLE BACK 60X90 (DRAPES) ×2 IMPLANT
DECANTER SPIKE VIAL GLASS SM (MISCELLANEOUS) ×2 IMPLANT
DRAPE LAPAROTOMY T 102X78X121 (DRAPES) ×2 IMPLANT
DRAPE UTILITY XL STRL (DRAPES) ×2 IMPLANT
DRSG TEGADERM 4X4.75 (GAUZE/BANDAGES/DRESSINGS) ×2 IMPLANT
ELECT REM PT RETURN 9FT ADLT (ELECTROSURGICAL) ×2
ELECTRODE REM PT RTRN 9FT ADLT (ELECTROSURGICAL) ×1 IMPLANT
GAUZE SPONGE 4X4 12PLY STRL LF (GAUZE/BANDAGES/DRESSINGS) ×4 IMPLANT
GLOVE BIO SURGEON STRL SZ7 (GLOVE) ×2 IMPLANT
GLOVE BIOGEL PI IND STRL 7.0 (GLOVE) ×1 IMPLANT
GLOVE BIOGEL PI IND STRL 7.5 (GLOVE) ×1 IMPLANT
GLOVE BIOGEL PI INDICATOR 7.0 (GLOVE) ×1
GLOVE BIOGEL PI INDICATOR 7.5 (GLOVE) ×1
GLOVE ECLIPSE 6.5 STRL STRAW (GLOVE) ×4 IMPLANT
GLOVE SKINSENSE NS SZ7.0 (GLOVE) ×1
GLOVE SKINSENSE STRL SZ7.0 (GLOVE) ×1 IMPLANT
GOWN PREVENTION PLUS XLARGE (GOWN DISPOSABLE) ×4 IMPLANT
NDL SAFETY ECLIPSE 18X1.5 (NEEDLE) IMPLANT
NEEDLE HYPO 18GX1.5 SHARP (NEEDLE)
NEEDLE HYPO 25X1 1.5 SAFETY (NEEDLE) ×2 IMPLANT
NS IRRIG 1000ML POUR BTL (IV SOLUTION) IMPLANT
PACK BASIN DAY SURGERY FS (CUSTOM PROCEDURE TRAY) ×2 IMPLANT
PENCIL BUTTON HOLSTER BLD 10FT (ELECTRODE) ×2 IMPLANT
SLEEVE SCD COMPRESS KNEE MED (MISCELLANEOUS) ×2 IMPLANT
SPONGE GAUZE 2X2 8PLY STRL LF (GAUZE/BANDAGES/DRESSINGS) ×2 IMPLANT
STAPLER VISISTAT 35W (STAPLE) IMPLANT
STRIP CLOSURE SKIN 1/2X4 (GAUZE/BANDAGES/DRESSINGS) ×2 IMPLANT
SUT MNCRL AB 4-0 PS2 18 (SUTURE) ×2 IMPLANT
SUT NOVA NAB DX-16 0-1 5-0 T12 (SUTURE) ×2 IMPLANT
SUT NOVA NAB GS-21 0 18 T12 DT (SUTURE) IMPLANT
SUT PDS AB 0 CT 36 (SUTURE) IMPLANT
SUT PROLENE 0 CT 1 30 (SUTURE) IMPLANT
SUT PROLENE 0 CT 1 CR/8 (SUTURE) IMPLANT
SUT SILK 3 0 TIES 17X18 (SUTURE)
SUT SILK 3-0 18XBRD TIE BLK (SUTURE) IMPLANT
SUT VIC AB 2-0 CT1 27 (SUTURE)
SUT VIC AB 2-0 CT1 TAPERPNT 27 (SUTURE) IMPLANT
SUT VIC AB 3-0 SH 27 (SUTURE) ×1
SUT VIC AB 3-0 SH 27X BRD (SUTURE) ×1 IMPLANT
SUT VIC AB 4-0 BRD 54 (SUTURE) IMPLANT
SUT VIC AB 4-0 SH 18 (SUTURE) IMPLANT
SUT VICRYL 4-0 PS2 18IN ABS (SUTURE) IMPLANT
SYR BULB 3OZ (MISCELLANEOUS) IMPLANT
SYR CONTROL 10ML LL (SYRINGE) IMPLANT
TOWEL OR 17X24 6PK STRL BLUE (TOWEL DISPOSABLE) ×2 IMPLANT
TOWEL OR NON WOVEN STRL DISP B (DISPOSABLE) ×2 IMPLANT
TUBE CONNECTING 20X1/4 (TUBING) ×2 IMPLANT
WATER STERILE IRR 1000ML POUR (IV SOLUTION) IMPLANT
YANKAUER SUCT BULB TIP NO VENT (SUCTIONS) IMPLANT

## 2012-09-05 NOTE — Transfer of Care (Signed)
Immediate Anesthesia Transfer of Care Note  Patient: Daniel Gardner  Procedure(s) Performed: Procedure(s) (LRB) with comments: HERNIA REPAIR UMBILICAL ADULT (N/A) - umbilical hernia repair   Patient Location: PACU  Anesthesia Type:General  Level of Consciousness: awake, alert , oriented and patient cooperative  Airway & Oxygen Therapy: Patient Spontanous Breathing and Patient connected to face mask oxygen  Post-op Assessment: Report given to PACU RN and Post -op Vital signs reviewed and stable  Post vital signs: Reviewed and stable  Complications: No apparent anesthesia complications

## 2012-09-05 NOTE — Anesthesia Postprocedure Evaluation (Signed)
  Anesthesia Post-op Note  Patient: Daniel Gardner  Procedure(s) Performed: Procedure(s) (LRB) with comments: HERNIA REPAIR UMBILICAL ADULT (N/A) - umbilical hernia repair   Patient Location: PACU  Anesthesia Type:General  Level of Consciousness: awake  Airway and Oxygen Therapy: Patient Spontanous Breathing  Post-op Pain: mild  Post-op Assessment: Post-op Vital signs reviewed  Post-op Vital Signs: Reviewed  Complications: No apparent anesthesia complications

## 2012-09-05 NOTE — Anesthesia Preprocedure Evaluation (Addendum)
Anesthesia Evaluation  Patient identified by MRN, date of birth, ID band Patient awake    Reviewed: Allergy & Precautions, H&P , NPO status , Patient's Chart, lab work & pertinent test results  Airway Mallampati: II      Dental   Pulmonary neg pulmonary ROS,          Cardiovascular negative cardio ROS  Rhythm:Regular Rate:Normal     Neuro/Psych    GI/Hepatic Neg liver ROS, GERD-  Controlled,  Endo/Other  negative endocrine ROS  Renal/GU negative Renal ROS     Musculoskeletal   Abdominal   Peds  Hematology   Anesthesia Other Findings   Reproductive/Obstetrics                           Anesthesia Physical Anesthesia Plan  ASA: II  Anesthesia Plan: General   Post-op Pain Management:    Induction: Intravenous  Airway Management Planned: Oral ETT  Additional Equipment:   Intra-op Plan:   Post-operative Plan: Extubation in OR  Informed Consent: I have reviewed the patients History and Physical, chart, labs and discussed the procedure including the risks, benefits and alternatives for the proposed anesthesia with the patient or authorized representative who has indicated his/her understanding and acceptance.     Plan Discussed with: CRNA, Anesthesiologist and Surgeon  Anesthesia Plan Comments:       Anesthesia Quick Evaluation

## 2012-09-05 NOTE — Interval H&P Note (Signed)
History and Physical Interval Note:  09/05/2012 10:45 AM  Daniel Gardner  has presented today for surgery, with the diagnosis of umbilical hernia  The various methods of treatment have been discussed with the patient and family. After consideration of risks, benefits and other options for treatment, the patient has consented to  Procedure(s) (LRB) with comments: HERNIA REPAIR UMBILICAL ADULT (N/A) - umbilical hernia repair possible mesh as a surgical intervention .  The patient's history has been reviewed, patient examined, no change in status, stable for surgery.  I have reviewed the patient's chart and labs.  Questions were answered to the patient's satisfaction.     Daniel Mak K.

## 2012-09-05 NOTE — Op Note (Signed)
Indications:  The patient presented with a history of small, non-reducible umbilical hernia.  The patient was examined and we recommended umbilical hernia repair..  Pre-operative diagnosis:  Umbilical hernia  Post-operative diagnosis:  Same  Procedure:  Umbilical hernia repair  Surgeon: Wynona Luna.   Assistants: none  Anesthesia: General endotracheal anesthesia  ASA Class: 2   Procedure Details  The patient was seen again in the Holding Room. The risks, benefits, complications, treatment options, and expected outcomes were discussed with the patient. The possibilities of reaction to medication, pulmonary aspiration, perforation of viscus, bleeding, recurrent infection, the need for additional procedures, and development of a complication requiring transfusion or further operation were discussed with the patient and/or family. There was concurrence with the proposed plan, and informed consent was obtained. The site of surgery was properly noted/marked. The patient was taken to the Operating Room, identified as Daniel Gardner, and the procedure verified as umbilical hernia repair. A Time Out was held and the above information confirmed.  After an adequate level of general anesthesia was obtained, the patient's abdomen was prepped with Chloraprep and draped in sterile fashion.  We made a transverse incision above the umbilicus.  Dissection was carried down to the hernia sac with cautery.  We dissected bluntly around the hernia sac down to the edge of the fascial defect.  We reduced the hernia sac back into the pre-peritoneal space.  The fascial defect measured less than 1 cm across.  We cleared the fascia in all directions.  The fascial defect was closed with multiple interrupted figure-of-eight 1 Novofil sutures.  The base of the umbilicus was tacked down with 3-0 Vicryl.  3-0 Vicryl was used to close the subcutaneous tissues and 4-0 Monocryl was used to close the skin.  Steri-strips  and clean dressing were applied.  The patient was extubated and brought to the recovery room in stable condition.  All sponge, instrument, and needle counts were correct prior to closure and at the conclusion of the case.   Estimated Blood Loss: Minimal          Complications: None; patient tolerated the procedure well.         Disposition: PACU - hemodynamically stable.         Condition: stable  Wilmon Arms. Corliss Skains, MD, [facility]  09/05/2012 12:35 PM

## 2012-09-05 NOTE — Anesthesia Procedure Notes (Signed)
Procedure Name: Intubation Date/Time: 09/05/2012 11:57 AM Performed by: Verlan Friends Pre-anesthesia Checklist: Patient identified, Emergency Drugs available, Suction available, Patient being monitored and Timeout performed Patient Re-evaluated:Patient Re-evaluated prior to inductionOxygen Delivery Method: Circle System Utilized Preoxygenation: Pre-oxygenation with 100% oxygen Intubation Type: IV induction Ventilation: Mask ventilation without difficulty Laryngoscope Size: Miller and 3 Grade View: Grade II Tube type: Oral Number of attempts: 1 Airway Equipment and Method: stylet and oral airway Placement Confirmation: ETT inserted through vocal cords under direct vision,  positive ETCO2 and breath sounds checked- equal and bilateral Secured at: 22 cm Tube secured with: Tape Dental Injury: Teeth and Oropharynx as per pre-operative assessment  Comments: Patient needed firm pressure over cords to see arythenoids

## 2012-09-05 NOTE — H&P (View-Only) (Signed)
Patient ID: KAMAL JURGENS, male   DOB: [birth year], 32 y.o.   MRN: [id number]  Chief Complaint  Patient presents with  . New Evaluation    umb hernia    HPI Daniel Gardner is a 32 y.o. male.   HPI This is a healthy 32 year old male who has noticed a small bulge at his umbilicus for a couple of years. This has become larger and is now more uncomfortable. He denies any obstructive symptoms. He has lost a considerable amount of weight recently with diet and exercise. No previous surgery.  Past Medical History  Diagnosis Date  . History of chickenpox     Past Surgical History  Procedure Date  . Mouth surgery     Family History  Problem Relation Age of Onset  . Hypertension Mother   . Hypertension Father   . Hyperlipidemia Father   . Diabetes Father   . Heart attack Neg Hx   . Sudden death Neg Hx   . Alcohol abuse    . Arthritis    . Cancer      colon,  . Cancer Maternal Grandmother     Colon  . Cancer Maternal Grandfather     Colon    Social History History  Substance Use Topics  . Smoking status: Former Smoker    Types: Cigarettes    Quit date: 08/14/2010  . Smokeless tobacco: Not on file     Comment: Quit 1 and 1/2 years ago  . Alcohol Use: Yes     Comment: social    Allergies  Allergen Reactions  . Sulfa Antibiotics     Current Outpatient Prescriptions  Medication Sig Dispense Refill  . amphetamine-dextroamphetamine (ADDERALL XR) 20 MG 24 hr capsule Take 1 capsule (20 mg total) by mouth every morning.  30 capsule  0  . ibuprofen (ADVIL,MOTRIN) 200 MG tablet Take 200 mg by mouth every 6 (six) hours as needed.        Review of Systems Review of Systems  Constitutional: Negative for fever, chills and unexpected weight change.  HENT: Negative for hearing loss, congestion, sore throat, trouble swallowing and voice change.   Eyes: Negative for visual disturbance.  Respiratory: Negative for cough and wheezing.   Cardiovascular: Negative for chest  pain, palpitations and leg swelling.  Gastrointestinal: Positive for abdominal pain. Negative for nausea, vomiting, diarrhea, constipation, blood in stool, abdominal distention, anal bleeding and rectal pain.  Genitourinary: Negative for hematuria and difficulty urinating.  Musculoskeletal: Negative for arthralgias.  Skin: Negative for rash and wound.  Neurological: Negative for seizures, syncope, weakness and headaches.  Hematological: Negative for adenopathy. Does not bruise/bleed easily.  Psychiatric/Behavioral: Negative for confusion.    Blood pressure 110/72, pulse 72, temperature 97.2 F (36.2 C), temperature source Temporal, resp. rate 18, height 6\' 2"  (1.88 m), weight 263 lb (119.296 kg).  Physical Exam Physical Exam WDWN in NAD HEENT:  EOMI, sclera anicteric Neck:  No masses, no thyromegaly Lungs:  CTA bilaterally; normal respiratory effort CV:  Regular rate and rhythm; no murmurs Abd:  +bowel sounds, soft, visible bulge in left upper umbilicus; enlarges with Valsalva; not reducible Ext:  Well-perfused; no edema Skin:  Warm, dry; no sign of jaundice  Data Reviewed none  Assessment    Umbilical hernia - not reducible; defect is probably very small    Plan    Umbilical hernia repair, possibly with mesh.  The surgical procedure has been discussed with the patient.  Potential risks, benefits, alternative treatments, and  expected outcomes have been explained.  All of the patient's questions at this time have been answered.  The likelihood of reaching the patient's treatment goal is good.  The patient understand the proposed surgical procedure and wishes to proceed.        Baelyn Doring K. 08/23/2012, 12:09 PM

## 2012-09-06 ENCOUNTER — Encounter (HOSPITAL_BASED_OUTPATIENT_CLINIC_OR_DEPARTMENT_OTHER): Payer: Self-pay | Admitting: Surgery

## 2012-09-20 ENCOUNTER — Encounter (INDEPENDENT_AMBULATORY_CARE_PROVIDER_SITE_OTHER): Payer: Self-pay | Admitting: Surgery

## 2012-09-20 ENCOUNTER — Ambulatory Visit (INDEPENDENT_AMBULATORY_CARE_PROVIDER_SITE_OTHER): Payer: No Typology Code available for payment source | Admitting: Surgery

## 2012-09-20 VITALS — BP 128/84 | HR 72 | Temp 97.9°F | Resp 18 | Ht 73.0 in | Wt 255.5 lb

## 2012-09-20 DIAGNOSIS — K429 Umbilical hernia without obstruction or gangrene: Secondary | ICD-10-CM

## 2012-09-20 NOTE — Progress Notes (Signed)
Status post open repair of a small umbilical hernia 09/05/12. The patient had a small defect that was primarily repaired with suture. He is to quite well. His incision is well-healed no sign of infection. No sign of recurrent hernia. He may begin increasing his level of activity and may resume full activity 2 weeks. Followup as needed  Wilmon Arms. Corliss Skains, MD, [facility]  09/20/2012 12:35 PM

## 2012-11-26 ENCOUNTER — Ambulatory Visit: Payer: No Typology Code available for payment source | Admitting: Internal Medicine

## 2012-12-09 ENCOUNTER — Ambulatory Visit: Payer: No Typology Code available for payment source | Admitting: Internal Medicine

## 2012-12-09 DIAGNOSIS — Z0289 Encounter for other administrative examinations: Secondary | ICD-10-CM

## 2013-11-27 ENCOUNTER — Telehealth: Payer: Self-pay | Admitting: Internal Medicine

## 2013-11-27 NOTE — Telephone Encounter (Signed)
lmovm with appt

## 2013-11-27 NOTE — Telephone Encounter (Signed)
Pt is ? allergy problems he will be off from work on 12/10/13 may I use the SDA 2 PM SLOT

## 2013-11-27 NOTE — Telephone Encounter (Signed)
okay

## 2013-12-10 ENCOUNTER — Ambulatory Visit (INDEPENDENT_AMBULATORY_CARE_PROVIDER_SITE_OTHER): Payer: Commercial Managed Care - PPO | Admitting: Internal Medicine

## 2013-12-10 ENCOUNTER — Encounter: Payer: Self-pay | Admitting: Internal Medicine

## 2013-12-10 VITALS — BP 134/84 | HR 84 | Temp 98.1°F | Ht 73.0 in | Wt 260.0 lb

## 2013-12-10 DIAGNOSIS — R0609 Other forms of dyspnea: Secondary | ICD-10-CM

## 2013-12-10 DIAGNOSIS — F988 Other specified behavioral and emotional disorders with onset usually occurring in childhood and adolescence: Secondary | ICD-10-CM

## 2013-12-10 DIAGNOSIS — R0989 Other specified symptoms and signs involving the circulatory and respiratory systems: Secondary | ICD-10-CM

## 2013-12-10 DIAGNOSIS — Z91013 Allergy to seafood: Secondary | ICD-10-CM | POA: Insufficient documentation

## 2013-12-10 DIAGNOSIS — R0683 Snoring: Secondary | ICD-10-CM | POA: Insufficient documentation

## 2013-12-10 MED ORDER — AMPHETAMINE-DEXTROAMPHET ER 20 MG PO CP24: 20.0000 mg | ORAL_CAPSULE | ORAL | Status: DC

## 2013-12-10 MED ORDER — EPINEPHRINE 0.3 MG/0.3ML IJ SOAJ: 0.3000 mg | Freq: Once | INTRAMUSCULAR | Status: DC

## 2013-12-10 NOTE — Progress Notes (Signed)
Subjective:    Patient ID: Daniel Gardner, male    DOB: 08-02-1981, 33 y.o.   MRN: [id number]  HPI  33 year old white male with history of attention deficit disorder, obesity and gastroesophageal reflux disease to reestablish. 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    Interval medical history-patient reports allergic reaction to shellfish. He first noticed getting a headache after eating oysters a few years ago. He had similar symptoms after eating crab legs. Over last several [place] After eating shrimp, he felt tongue swelling and tightening of his throat. His symptoms resolved with immediately taking Benadryl.  Patient has gained weight since previous visit. His wife has noticed loud snoring.  He reports history of deviated septum. He is wondering whether referral to ENT may help.  Review of Systems Positive for snoring.  Mild daytime somnolence.    Past Medical History  Diagnosis Date  . GERD (gastroesophageal reflux disease)     OTC prn  . Umbilical hernia 08/2012  . ADD (attention deficit disorder)   . Blister of finger 08/29/2012    left ring  . Sty 08/29/2012    of eye, will finish med. 08/31/2012    History   Social History  . Marital Status: Married    Spouse Name: N/A    Number of Children: N/A  . Years of Education: N/A   Occupational History  . Not on file.   Social History Main Topics  . Smoking status: Former Smoker    Quit date: 08/14/2010  . Smokeless tobacco: Former NeurosurgeonUser    Quit date: 08/14/2010  . Alcohol Use: Yes     Comment: occasionally  . Drug Use: No  . Sexual Activity: Not on file   Other Topics Concern  . Not on file   Social History Narrative  . No narrative on file    Past Surgical History  Procedure Laterality Date  . Dental surgery  as a child    X 2 - for tooth extraction  . Umbilical hernia repair  09/05/2012    Procedure: HERNIA REPAIR UMBILICAL ADULT;  Surgeon: Wilmon ArmsMatthew  K. Corliss Skainssuei, MD;  Location: [facility];  Service: General;  Laterality: N/A;  umbilical hernia repair     Family History  Problem Relation Age of Onset  . Hypertension Mother   . Hypertension Father   . Hyperlipidemia Father   . Diabetes Father   . Cancer - Colon Maternal Grandmother   . Cancer - Colon Maternal Grandfather     Allergies  Allergen Reactions  . Sulfa Antibiotics Other (See Comments)    UNKNOWN REACTION - WAS AS A CHILD    Current Outpatient Prescriptions on File Prior to Visit  Medication Sig Dispense Refill  . omeprazole (PRILOSEC) 20 MG capsule Take 20 mg by mouth daily as needed.        No current facility-administered medications on file prior to visit.    BP 134/84  Pulse 84  Temp(Src) 98.1 F (36.7 C) (Oral)  Ht 6\' 1"  (1.854 m)  Wt 260 lb (117.935 kg)  BMI 34.31 kg/m2    Objective:   Physical Exam  Constitutional: He is oriented to person, place, and time. He appears well-developed and well-nourished. No distress.  HENT:  Head: Normocephalic and atraumatic.  Right Ear: External ear normal.  Left Ear: External ear normal.  Mouth/Throat: Oropharynx is clear and moist.  Neck: Neck supple.  Thick neck  Cardiovascular: Normal  rate, regular rhythm and normal heart sounds.   Pulmonary/Chest: Effort normal and breath sounds normal. He has no wheezes.  Lymphadenopathy:    He has no cervical adenopathy.  Neurological: He is alert and oriented to person, place, and time. No cranial nerve deficit.  Skin: Skin is warm and dry.  Psychiatric: He has a normal mood and affect. His behavior is normal.        Assessment & Plan:

## 2013-12-10 NOTE — Assessment & Plan Note (Signed)
Screen for OSA.  Arrange in home sleep study.  Weight loss strongly encouraged.

## 2013-12-10 NOTE — Assessment & Plan Note (Signed)
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 Patient understands importance of allergen avoidance. Use EpiPen in case of future exposures/reaction.  We discussed possible cross-reactivity with IV contrast. He will notify radiology staff of shellfish allergy.

## 2013-12-10 NOTE — Assessment & Plan Note (Signed)
Restart Adderall XR 20 mg.

## 2013-12-10 NOTE — Progress Notes (Signed)
Pre visit review using our clinic review tool, if applicable. No additional management support is needed unless otherwise documented below in the visit note. 

## 2014-02-09 ENCOUNTER — Telehealth: Payer: Self-pay | Admitting: Internal Medicine

## 2014-02-09 ENCOUNTER — Ambulatory Visit: Payer: Commercial Managed Care - PPO | Admitting: Internal Medicine

## 2014-02-09 DIAGNOSIS — F988 Other specified behavioral and emotional disorders with onset usually occurring in childhood and adolescence: Secondary | ICD-10-CM

## 2014-02-09 MED ORDER — AMPHETAMINE-DEXTROAMPHET ER 20 MG PO CP24: 20.0000 mg | ORAL_CAPSULE | ORAL | Status: DC

## 2014-02-09 NOTE — Telephone Encounter (Signed)
Pt is needing new rx for amphetamine-dextroamphetamine (ADDERALL XR) 20 MG 24 hr capsule Please call when available for pick.

## 2014-02-09 NOTE — Telephone Encounter (Signed)
rx up front for pick up, pt aware

## 2014-02-09 NOTE — Telephone Encounter (Signed)
Ok to RF x 1

## 2014-02-09 NOTE — Telephone Encounter (Signed)
Pt states he was able to miss work today and needed to r/s his appointment for today to Wednesday 02/11/14.  Pt would like to know if you can fill his amphetamine-dextroamphetamine (ADDERALL XR) 20 MG 24 hr capsule today was his last pill.

## 2014-02-09 NOTE — Telephone Encounter (Signed)
duplicate

## 2014-02-11 ENCOUNTER — Encounter: Payer: Self-pay | Admitting: Internal Medicine

## 2014-02-11 ENCOUNTER — Ambulatory Visit (INDEPENDENT_AMBULATORY_CARE_PROVIDER_SITE_OTHER): Payer: Commercial Managed Care - PPO | Admitting: Internal Medicine

## 2014-02-11 VITALS — BP 134/92 | HR 80 | Temp 98.1°F | Ht 73.0 in | Wt 255.0 lb

## 2014-02-11 DIAGNOSIS — R0989 Other specified symptoms and signs involving the circulatory and respiratory systems: Secondary | ICD-10-CM

## 2014-02-11 DIAGNOSIS — R0683 Snoring: Secondary | ICD-10-CM

## 2014-02-11 DIAGNOSIS — R0609 Other forms of dyspnea: Secondary | ICD-10-CM

## 2014-02-11 DIAGNOSIS — F988 Other specified behavioral and emotional disorders with onset usually occurring in childhood and adolescence: Secondary | ICD-10-CM

## 2014-02-11 MED ORDER — AMPHETAMINE-DEXTROAMPHET ER 20 MG PO CP24: 20.0000 mg | ORAL_CAPSULE | ORAL | Status: DC

## 2014-02-11 NOTE — Assessment & Plan Note (Signed)
Reinitiate referral for in-home sleep study. Weight loss strategies discussed.

## 2014-02-11 NOTE — Progress Notes (Signed)
Pre visit review using our clinic review tool, if applicable. No additional management support is needed unless otherwise documented below in the visit note. 

## 2014-02-11 NOTE — Assessment & Plan Note (Signed)
Improved with Adderall XR 20 mg.  He takes at 8 AM and medication lasts until 6 PM.  Continue same dose.  Rx for 3 month supply provided.

## 2014-02-11 NOTE — Progress Notes (Signed)
Subjective:    Patient ID: Daniel Gardner, male    DOB: 08-07-1981, 33 y.o.   MRN: [id number]  HPI  33 year old white male with history of attention deficit disorder and obesity for followup. Patient reports his issues with inability to focus has significantly improved since restarting Adderall. He takes his medication between 7:30 and 8:00 in the morning. The effects of Adderall XR do not wear off until approximately 6 PM.  Patient previously referred for in-home sleep study. Patient never received communication regarding arranging study.   He still complains of daytime somnolence and loud snoring.  Review of Systems Mild weight loss    Past Medical History  Diagnosis Date  . GERD (gastroesophageal reflux disease)     OTC prn  . Umbilical hernia 08/2012  . ADD (attention deficit disorder)   . Blister of finger 08/29/2012    left ring  . Sty 08/29/2012    of eye, will finish med. 08/31/2012    History   Social History  . Marital Status: Married    Spouse Name: N/A    Number of Children: N/A  . Years of Education: N/A   Occupational History  . Not on file.   Social History Main Topics  . Smoking status: Former Smoker    Quit date: 08/14/2010  . Smokeless tobacco: Former NeurosurgeonUser    Quit date: 08/14/2010  . Alcohol Use: Yes     Comment: occasionally  . Drug Use: No  . Sexual Activity: Not on file   Other Topics Concern  . Not on file   Social History Narrative  . No narrative on file    Past Surgical History  Procedure Laterality Date  . Dental surgery  as a child    X 2 - for tooth extraction  . Umbilical hernia repair  09/05/2012    Procedure: HERNIA REPAIR UMBILICAL ADULT;  Surgeon: Wilmon ArmsMatthew K. Corliss Skainssuei, MD;  Location: [facility];  Service: General;  Laterality: N/A;  umbilical hernia repair     Family History  Problem Relation Age of Onset  . Hypertension Mother   . Hypertension Father   . Hyperlipidemia Father   . Diabetes Father   .  Cancer - Colon Maternal Grandmother   . Cancer - Colon Maternal Grandfather     Allergies  Allergen Reactions  . Sulfa Antibiotics Other (See Comments)    UNKNOWN REACTION - WAS AS A CHILD    Current Outpatient Prescriptions on File Prior to Visit  Medication Sig Dispense Refill  . EPINEPHrine (EPIPEN 2-PAK) 0.3 mg/0.3 mL SOAJ injection Inject 0.3 mLs (0.3 mg total) into the muscle once.  1 Device  5  . omeprazole (PRILOSEC) 20 MG capsule Take 20 mg by mouth daily as needed.        No current facility-administered medications on file prior to visit.    BP 134/92  Pulse 80  Temp(Src) 98.1 F (36.7 C) (Oral)  Ht 6\' 1"  (1.854 m)  Wt 255 lb (115.667 kg)  BMI 33.65 kg/m2    Objective:   Physical Exam  Constitutional: He is oriented to person, place, and time. He appears well-developed and well-nourished. No distress.  Cardiovascular: Normal rate, regular rhythm and normal heart sounds.   No murmur heard. Pulmonary/Chest: Effort normal and breath sounds normal. He has no wheezes.  Neurological: He is alert and oriented to person, place, and time. No cranial nerve deficit.  Psychiatric: He has a normal mood and affect. His behavior is  normal.          Assessment & Plan:

## 2014-02-18 IMAGING — CR DG KNEE COMPLETE 4+V*L*
4 series · 4 of 4 positions shown · non-contrast
Comparison: None

CLINICAL DATA: Left knee pain following injury.

LEFT KNEE - COMPLETE 4+ VIEW

[t knee ap left]
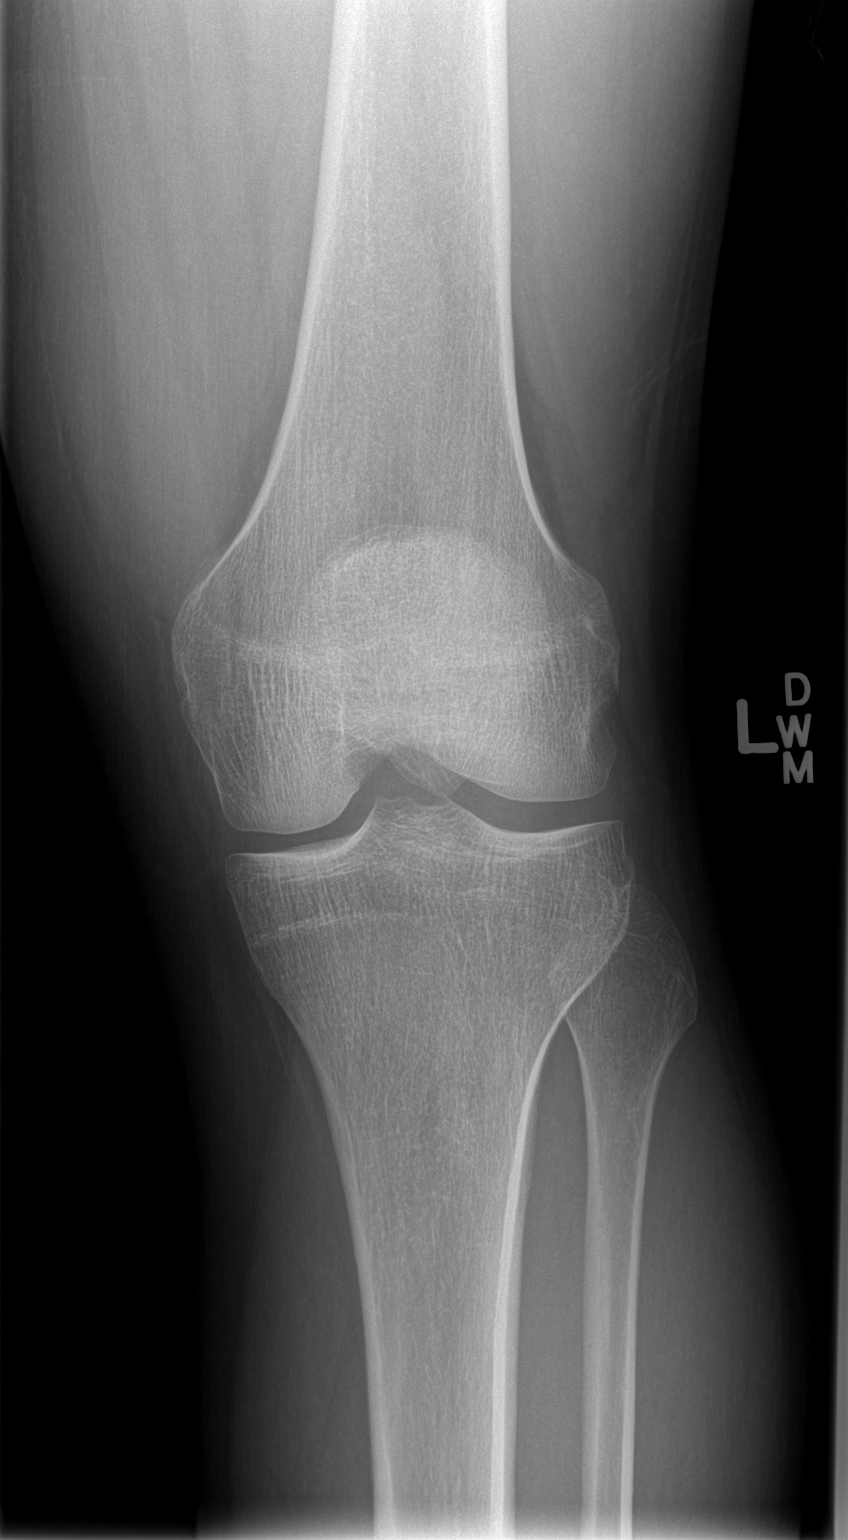

[t knee oblique left (1 of 2)]
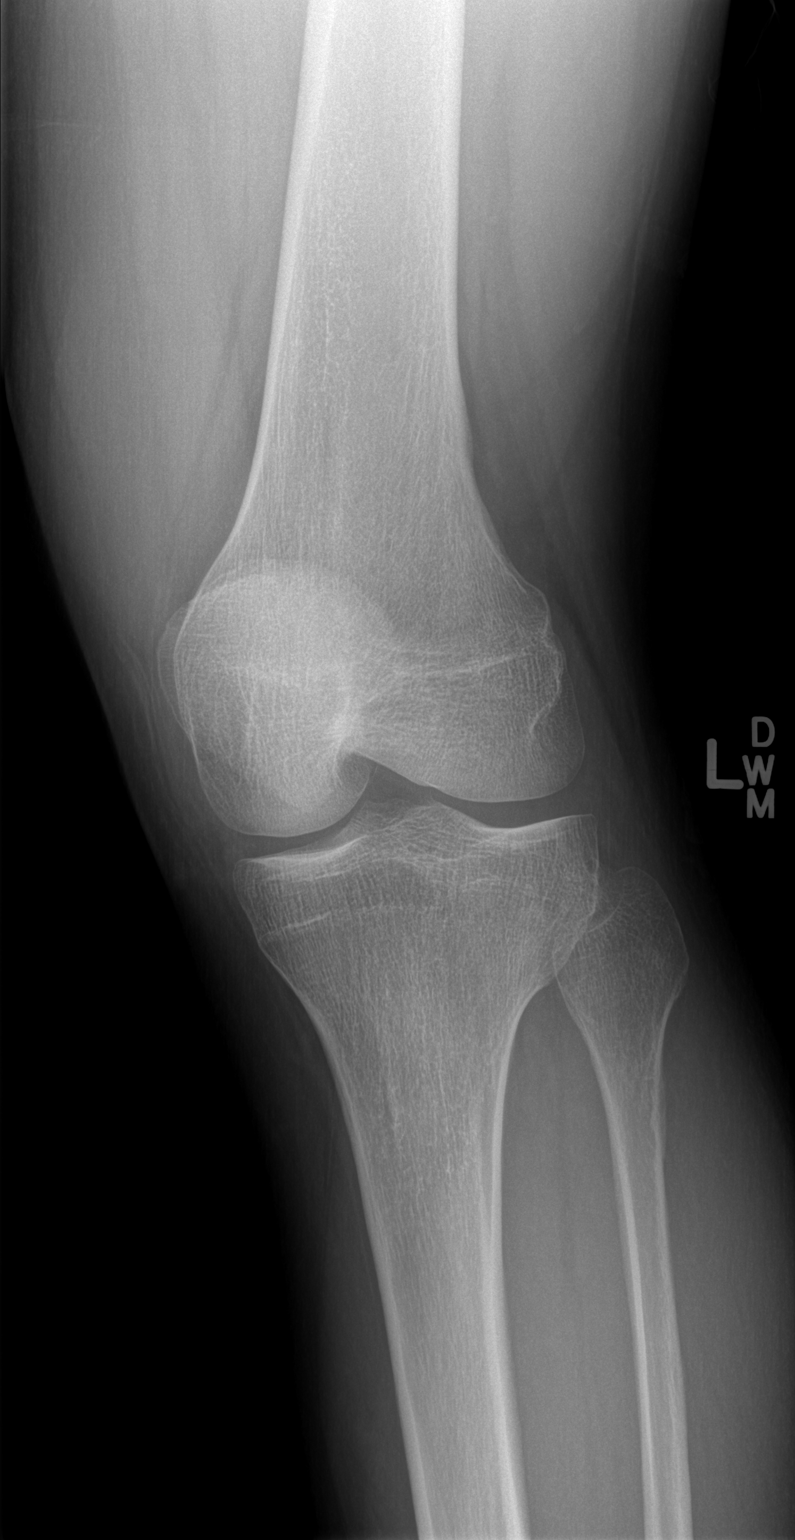

[t knee oblique left (2 of 2)]
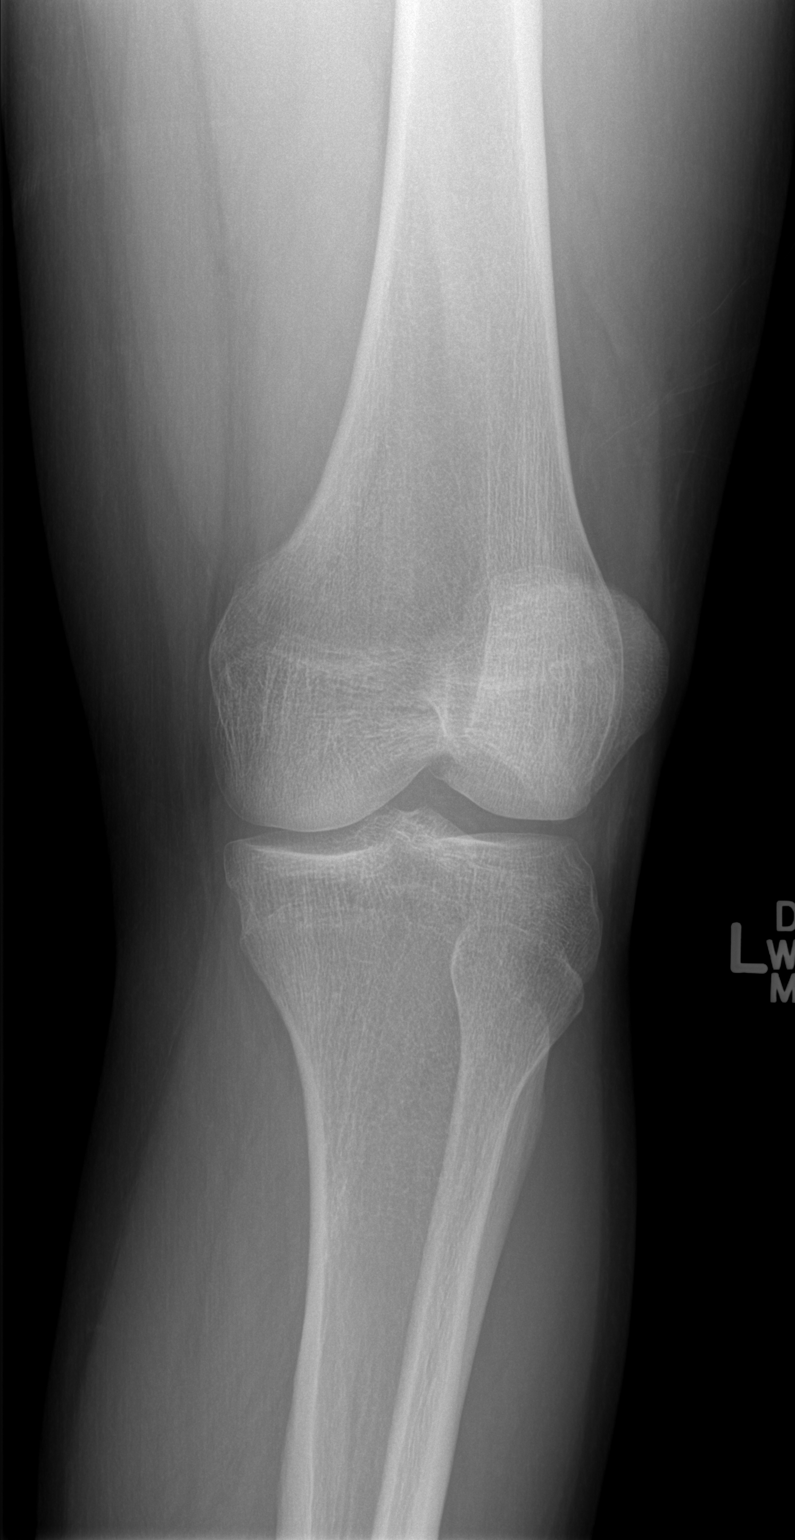

[t knee lat left]
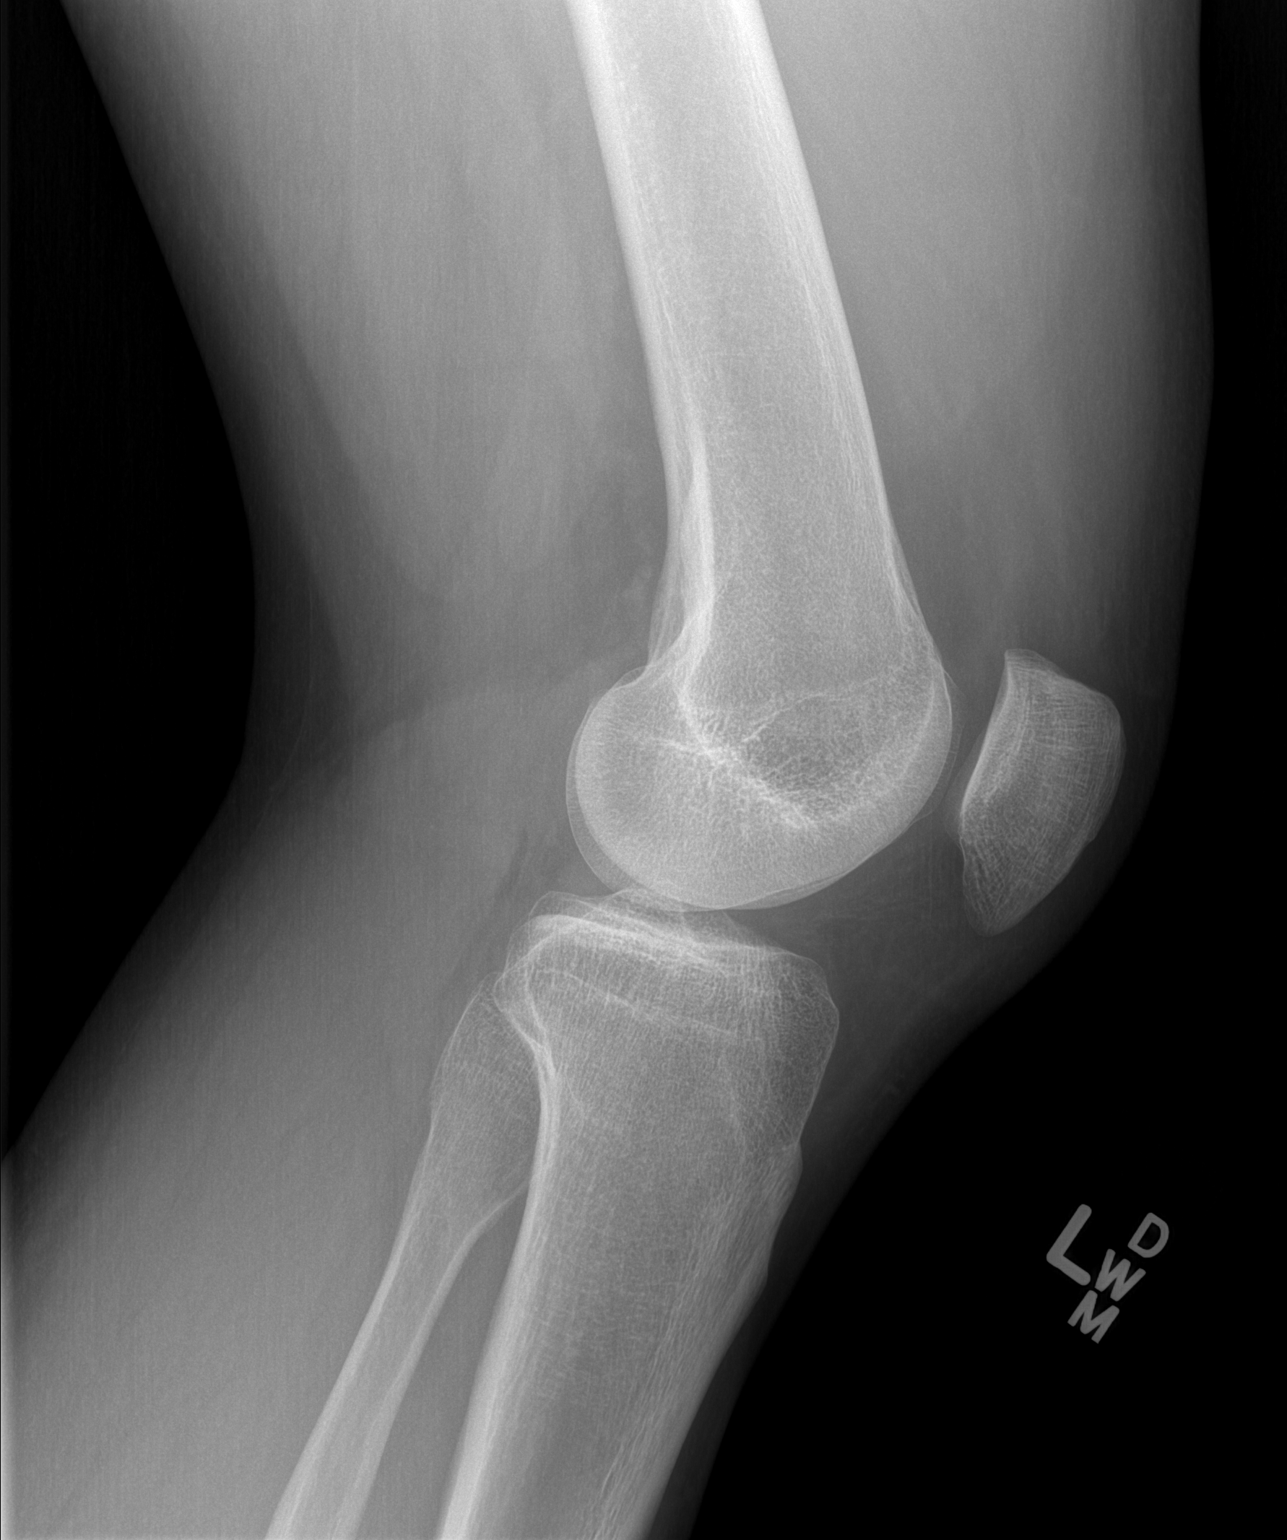

[4 of 4 positions shown; findings below may reference images not displayed]

FINDINGS: No evidence of acute fracture, subluxation or dislocation
identified.

No radio-opaque foreign bodies are present.

No focal bony lesions are noted.

The joint spaces are unremarkable.

There may be a small joint effusion present.
IMPRESSION: Question small joint effusion - no evidence of bony abnormality.

## 2014-03-11 DIAGNOSIS — G4733 Obstructive sleep apnea (adult) (pediatric): Secondary | ICD-10-CM

## 2014-03-12 ENCOUNTER — Telehealth: Payer: Self-pay | Admitting: Internal Medicine

## 2014-03-12 NOTE — Telephone Encounter (Signed)
Ok to provide earlier refill.  Send new rx if neccessary

## 2014-03-12 NOTE — Telephone Encounter (Signed)
Pt states he picked up last rx for amphetamine-dextroamphetamine (ADDERALL XR) 20 MG 24 hr capsule, pt states the days are off because he took his last one yesterday, and went to fill the rx and the pharmacy stated he can not fill it until 8/1. Pt stated the pharmacy stated they sent the rx over to see if it can be overturned since pt is out.

## 2014-03-13 DIAGNOSIS — G4733 Obstructive sleep apnea (adult) (pediatric): Secondary | ICD-10-CM

## 2014-03-13 NOTE — Telephone Encounter (Signed)
Called pharmacy and told them ok to refill early, pt stated he already got it.  Nothing further needed

## 2014-03-16 ENCOUNTER — Encounter: Payer: Self-pay | Admitting: Internal Medicine

## 2014-05-13 ENCOUNTER — Telehealth: Payer: Self-pay | Admitting: Internal Medicine

## 2014-05-13 DIAGNOSIS — F988 Other specified behavioral and emotional disorders with onset usually occurring in childhood and adolescence: Secondary | ICD-10-CM

## 2014-05-13 NOTE — Telephone Encounter (Signed)
Pt said his appt is not till Monday 05/18/14 and he is out of his  amphetamine-dextroamphetamine (ADDERALL XR) 20 MG 24 hr capsule and would like to know if he can rx  5 pills till his visit.

## 2014-05-14 MED ORDER — AMPHETAMINE-DEXTROAMPHET ER 20 MG PO CP24: 20.0000 mg | ORAL_CAPSULE | ORAL | Status: DC

## 2014-05-14 NOTE — Telephone Encounter (Signed)
Ok to RF x 1.  He can see another provider

## 2014-05-14 NOTE — Telephone Encounter (Signed)
Pt would like to know if he can have a rx till his appt. Should he see matt since dr Artist Paisyoo may not be here? Pt out and request asap.

## 2014-05-14 NOTE — Telephone Encounter (Signed)
rx up front for p/u, pt aware 

## 2014-05-15 ENCOUNTER — Ambulatory Visit: Payer: Commercial Managed Care - PPO | Admitting: Internal Medicine

## 2014-05-18 ENCOUNTER — Ambulatory Visit: Payer: Commercial Managed Care - PPO | Admitting: Internal Medicine

## 2014-05-19 ENCOUNTER — Ambulatory Visit: Payer: Commercial Managed Care - PPO | Admitting: Physician Assistant

## 2014-05-19 DIAGNOSIS — Z0289 Encounter for other administrative examinations: Secondary | ICD-10-CM

## 2014-06-16 ENCOUNTER — Telehealth: Payer: Self-pay | Admitting: Internal Medicine

## 2014-06-16 DIAGNOSIS — F988 Other specified behavioral and emotional disorders with onset usually occurring in childhood and adolescence: Secondary | ICD-10-CM

## 2014-06-16 NOTE — Telephone Encounter (Signed)
Pt needs new rx generic adderall xr 20 mg °

## 2014-06-17 MED ORDER — AMPHETAMINE-DEXTROAMPHET ER 20 MG PO CP24: 20.0000 mg | ORAL_CAPSULE | ORAL | Status: DC

## 2014-06-17 NOTE — Telephone Encounter (Signed)
Ok to RF x 3 

## 2014-06-18 NOTE — Telephone Encounter (Signed)
rx up front for p/u, pt aware 

## 2014-09-18 ENCOUNTER — Telehealth: Payer: Self-pay | Admitting: Internal Medicine

## 2014-09-18 DIAGNOSIS — F988 Other specified behavioral and emotional disorders with onset usually occurring in childhood and adolescence: Secondary | ICD-10-CM

## 2014-09-18 MED ORDER — AMPHETAMINE-DEXTROAMPHET ER 20 MG PO CP24: 20.0000 mg | ORAL_CAPSULE | ORAL | Status: DC

## 2014-09-18 NOTE — Telephone Encounter (Signed)
Dr. Durene CalHunter signed for 1 mth.  Rx up front for p/u, pt aware

## 2014-09-18 NOTE — Telephone Encounter (Signed)
Pt needs new rx generic adderall xr 20 mg °

## 2014-09-18 NOTE — Telephone Encounter (Signed)
Please advise 

## 2014-09-18 NOTE — Telephone Encounter (Signed)
Ok for RF x 2 

## 2014-09-18 NOTE — Telephone Encounter (Signed)
Dr Durene CalHunter, will you sign for this?  It's a Artist PaisYoo pt

## 2014-09-18 NOTE — Telephone Encounter (Signed)
If Dr. Artist PaisYoo ok with it, I am happy to sign for it.

## 2014-10-22 ENCOUNTER — Other Ambulatory Visit: Payer: Self-pay | Admitting: Internal Medicine

## 2014-10-22 DIAGNOSIS — F988 Other specified behavioral and emotional disorders with onset usually occurring in childhood and adolescence: Secondary | ICD-10-CM

## 2014-10-22 NOTE — Telephone Encounter (Signed)
Pt request refill amphetamine-dextroamphetamine (ADDERALL XR) 20 MG 24 hr capsule 3 mo supply  Pt not seen since 02/2014. Pt would like to know when to come

## 2014-10-23 MED ORDER — AMPHETAMINE-DEXTROAMPHET ER 20 MG PO CP24: 20.0000 mg | ORAL_CAPSULE | ORAL | Status: DC

## 2014-10-23 NOTE — Telephone Encounter (Signed)
Ok per Dr Artist PaisYoo, rx up front for pt to p/u

## 2014-10-23 NOTE — Telephone Encounter (Signed)
He needs to come every 6 months.  If he is out, ok to provide one month supply until he can make an appt

## 2014-10-23 NOTE — Telephone Encounter (Signed)
Pt has made an appt for 11-25-14

## 2014-11-23 ENCOUNTER — Ambulatory Visit (INDEPENDENT_AMBULATORY_CARE_PROVIDER_SITE_OTHER): Payer: Commercial Managed Care - PPO | Admitting: Internal Medicine

## 2014-11-23 ENCOUNTER — Encounter: Payer: Self-pay | Admitting: Internal Medicine

## 2014-11-23 VITALS — BP 138/90 | HR 64 | Temp 98.3°F | Ht 74.0 in | Wt 260.0 lb

## 2014-11-23 DIAGNOSIS — R030 Elevated blood-pressure reading, without diagnosis of hypertension: Secondary | ICD-10-CM

## 2014-11-23 DIAGNOSIS — I10 Essential (primary) hypertension: Secondary | ICD-10-CM | POA: Insufficient documentation

## 2014-11-23 DIAGNOSIS — F988 Other specified behavioral and emotional disorders with onset usually occurring in childhood and adolescence: Secondary | ICD-10-CM

## 2014-11-23 DIAGNOSIS — F909 Attention-deficit hyperactivity disorder, unspecified type: Secondary | ICD-10-CM | POA: Diagnosis not present

## 2014-11-23 MED ORDER — EPINEPHRINE 0.3 MG/0.3ML IJ SOAJ: 0.3000 mg | Freq: Once | INTRAMUSCULAR | Status: AC

## 2014-11-23 MED ORDER — AMPHETAMINE-DEXTROAMPHET ER 30 MG PO CP24: 30.0000 mg | ORAL_CAPSULE | Freq: Every day | ORAL | Status: DC

## 2014-11-23 NOTE — Assessment & Plan Note (Signed)
Patient recently promoted at work. He has extended work hours. Current dose of Adderall wearing off too soon. Increase dose to Adderall XR 30 mg.  We discussed possible side effects.  Reassess in 2 months.

## 2014-11-23 NOTE — Progress Notes (Signed)
Pre visit review using our clinic review tool, if applicable. No additional management support is needed unless otherwise documented below in the visit note. 

## 2014-11-23 NOTE — Patient Instructions (Signed)
Monitor your blood pressure at home 3-4 times per week Work on weight loss, low salt diet and regular aerobic exercise

## 2014-11-23 NOTE — Assessment & Plan Note (Signed)
Weight loss, health diet and regular exercise encouraged.  Monitor BP at home.

## 2014-11-23 NOTE — Progress Notes (Signed)
   Subjective:    Patient ID: Daniel Gardner, male    DOB: 1980-10-01, 34 y.o.   MRN: [id number]  HPI  34 year old white male follow-up regarding ADD. Since previous visit patient promoted at work. He has additional job responsibilities. 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 He is going to work earlier in the morning (7 AM) and often works until 6 to 6:30 PM.  His current dose of Adderall XR is wearing off late in the afternoon.   Review of Systems Mild weight gain.  BP slightly elevated.  He is asymptomatic.    Past Medical History  Diagnosis Date  . GERD (gastroesophageal reflux disease)     OTC prn  . Umbilical hernia 08/2012  . ADD (attention deficit disorder)   . Blister of finger 08/29/2012    left ring  . Sty 08/29/2012    of eye, will finish med. 08/31/2012    History   Social History  . Marital Status: Married    Spouse Name: N/A  . Number of Children: N/A  . Years of Education: N/A   Occupational History  . Not on file.   Social History Main Topics  . Smoking status: Former Smoker    Quit date: 08/14/2010  . Smokeless tobacco: Former NeurosurgeonUser    Quit date: 08/14/2010  . Alcohol Use: Yes     Comment: occasionally  . Drug Use: No  . Sexual Activity: Not on file   Other Topics Concern  . Not on file   Social History Narrative    Past Surgical History  Procedure Laterality Date  . Dental surgery  as a child    X 2 - for tooth extraction  . Umbilical hernia repair  09/05/2012    Procedure: HERNIA REPAIR UMBILICAL ADULT;  Surgeon: Wilmon ArmsMatthew K. Corliss Skainssuei, MD;  Location: [facility];  Service: General;  Laterality: N/A;  umbilical hernia repair     Family History  Problem Relation Age of Onset  . Hypertension Mother   . Hypertension Father   . Hyperlipidemia Father   . Diabetes Father   . Cancer - Colon Maternal Grandmother   . Cancer - Colon Maternal Grandfather     Allergies  Allergen Reactions  . Sulfa  Antibiotics Other (See Comments)    UNKNOWN REACTION - WAS AS A CHILD    Current Outpatient Prescriptions on File Prior to Visit  Medication Sig Dispense Refill  . omeprazole (PRILOSEC) 20 MG capsule Take 20 mg by mouth daily as needed.      No current facility-administered medications on file prior to visit.    BP 138/90 mmHg  Pulse 64  Temp(Src) 98.3 F (36.8 C) (Oral)  Ht 6\' 2"  (1.88 m)  Wt 260 lb (117.935 kg)  BMI 33.37 kg/m2    Objective:   Physical Exam  Constitutional: He is oriented to person, place, and time. He appears well-developed and well-nourished. No distress.  Cardiovascular: Normal rate, regular rhythm and normal heart sounds.   No murmur heard. Pulmonary/Chest: Effort normal and breath sounds normal. He has no wheezes.  Neurological: He is alert and oriented to person, place, and time. No cranial nerve deficit.          Assessment & Plan:

## 2014-11-25 ENCOUNTER — Ambulatory Visit: Payer: Commercial Managed Care - PPO | Admitting: Internal Medicine

## 2015-01-20 ENCOUNTER — Telehealth: Payer: Self-pay | Admitting: Internal Medicine

## 2015-01-20 NOTE — Telephone Encounter (Signed)
Pt scheduled  

## 2015-01-20 NOTE — Telephone Encounter (Signed)
Daniel Gardner, Pt needs to have a follow up with Dr.K due to change in medication dosage. Please schedule this week.

## 2015-01-20 NOTE — Telephone Encounter (Signed)
Lupita LeashDonna, pt was here on 11/2014.  Could you ask Dr Kirtland BouchardK to refill for Dr Artist PaisYoo?  Thank you!

## 2015-01-20 NOTE — Telephone Encounter (Signed)
Pt call to reschedule his appt with Dr Artist PaisYoo for 01/22/15 it was for a 2 month fup. The following rx will expire amphetamine-dextroamphetamine (ADDERALL XR) 30 MG 24 hr capsule. Can pt have another refill or does he need to reschedule with someone else to get a refill.

## 2015-01-20 NOTE — Telephone Encounter (Signed)
lmovm to c/b and schedule an appt with Dr Kirtland BouchardK . Lupita LeashDonna said can use SDA

## 2015-01-21 ENCOUNTER — Ambulatory Visit (INDEPENDENT_AMBULATORY_CARE_PROVIDER_SITE_OTHER): Payer: Commercial Managed Care - PPO | Admitting: Internal Medicine

## 2015-01-21 ENCOUNTER — Encounter: Payer: Self-pay | Admitting: Internal Medicine

## 2015-01-21 VITALS — BP 138/90 | HR 97 | Temp 98.0°F | Resp 20 | Ht 74.0 in | Wt 247.0 lb

## 2015-01-21 DIAGNOSIS — E669 Obesity, unspecified: Secondary | ICD-10-CM | POA: Diagnosis not present

## 2015-01-21 DIAGNOSIS — F988 Other specified behavioral and emotional disorders with onset usually occurring in childhood and adolescence: Secondary | ICD-10-CM

## 2015-01-21 DIAGNOSIS — F909 Attention-deficit hyperactivity disorder, unspecified type: Secondary | ICD-10-CM

## 2015-01-21 MED ORDER — AMPHETAMINE-DEXTROAMPHET ER 30 MG PO CP24: 30.0000 mg | ORAL_CAPSULE | Freq: Every day | ORAL | Status: DC

## 2015-01-21 NOTE — Patient Instructions (Signed)
Limit your sodium (Salt) intake  Please check your blood pressure on a regular basis.  If it is consistently greater than 150/90, please make an office appointment.    It is important that you exercise regularly, at least 20 minutes 3 to 4 times per week.  If you develop chest pain or shortness of breath seek  medical attention.  Return in 6 months for follow-up  

## 2015-01-21 NOTE — Progress Notes (Signed)
Subjective:    Patient ID: Daniel Gardner, male    DOB: 02-18-1981, 34 y.o.   MRN: [id number]  HPI  Wt Readings from Last 3 Encounters:  01/21/15 247 lb (112.038 kg)  11/23/14 260 lb (117.935 kg)  02/11/14 255 lb (115.667 kg)    BP Readings from Last 3 Encounters:  01/21/15 138/90  11/23/14 138/90  02/11/14 79/56    34 year old patient who is seen today for follow-up of ADD.  There was a dose is changed about 2 months ago and he his quite pleased with his present results.  There was some early afternoon tapering of the medication effect and now he states with the higher dose effects lasting at least 7 PM.  No insomnia issues.  He states that he had some very brief decrease in his appetite, but now this has normalized.  There has been some weight loss as encouraged due to modification of his breakfast.  He is also walking when he plays golf rather than writing.  He feels quite well  Past Medical History  Diagnosis Date  . GERD (gastroesophageal reflux disease)     OTC prn  . Umbilical hernia 08/2012  . ADD (attention deficit disorder)   . Blister of finger 08/29/2012    left ring  . Sty 08/29/2012    of eye, will finish med. 08/31/2012    History   Social History  . Marital Status: Married    Spouse Name: N/A  . Number of Children: N/A  . Years of Education: N/A   Occupational History  . Not on file.   Social History Main Topics  . Smoking status: Former Smoker    Quit date: 08/14/2010  . Smokeless tobacco: Former Neurosurgeon    Quit date: 08/14/2010  . Alcohol Use: Yes     Comment: occasionally  . Drug Use: No  . Sexual Activity: Not on file   Other Topics Concern  . Not on file   Social History Narrative    Past Surgical History  Procedure Laterality Date  . Dental surgery  as a child    X 2 - for tooth extraction  . Umbilical hernia repair  09/05/2012    Procedure: HERNIA REPAIR UMBILICAL ADULT;  Surgeon: Wilmon Arms. Corliss Skains, MD;  Location: [facility];  Service: General;  Laterality: N/A;  umbilical hernia repair     Family History  Problem Relation Age of Onset  . Hypertension Mother   . Hypertension Father   . Hyperlipidemia Father   . Diabetes Father   . Cancer - Colon Maternal Grandmother   . Cancer - Colon Maternal Grandfather     Allergies  Allergen Reactions  . Sulfa Antibiotics Other (See Comments)    UNKNOWN REACTION - WAS AS A CHILD    Current Outpatient Prescriptions on File Prior to Visit  Medication Sig Dispense Refill  . EPINEPHrine (EPIPEN 2-PAK) 0.3 mg/0.3 mL IJ SOAJ injection Inject 0.3 mLs (0.3 mg total) into the muscle once. 1 Device 5  . omeprazole (PRILOSEC) 20 MG capsule Take 20 mg by mouth daily as needed.      No current facility-administered medications on file prior to visit.    BP 138/90 mmHg  Pulse 97  Temp(Src) 98 F (36.7 C) (Oral)  Resp 20  Ht  (1.88 m)  Wt 247 lb (112.038 kg)  BMI 31.70 kg/m2  SpO2 98%     Review of Systems  Constitutional: Negative for fever, chills, appetite  change and fatigue.  HENT: Negative for congestion, dental problem, ear pain, hearing loss, sore throat, tinnitus, trouble swallowing and voice change.   Eyes: Negative for pain, discharge and visual disturbance.  Respiratory: Negative for cough, chest tightness, wheezing and stridor.   Cardiovascular: Negative for chest pain, palpitations and leg swelling.  Gastrointestinal: Negative for nausea, vomiting, abdominal pain, diarrhea, constipation, blood in stool and abdominal distention.  Genitourinary: Negative for urgency, hematuria, flank pain, discharge, difficulty urinating and genital sores.  Musculoskeletal: Negative for myalgias, back pain, joint swelling, arthralgias, gait problem and neck stiffness.  Skin: Negative for rash.  Neurological: Negative for dizziness, syncope, speech difficulty, weakness, numbness and headaches.  Hematological: Negative for adenopathy. Does not bruise/bleed  easily.  Psychiatric/Behavioral: Negative for behavioral problems and dysphoric mood. The patient is not nervous/anxious.        Objective:   Physical Exam  Constitutional: He appears well-developed and well-nourished. No distress.  Serial blood pressure readings as low as 124 over 84          Assessment & Plan:    ADD.  Clinically stable on his present dose Obesity.  Will continue his efforts at low salt diet and weight loss and aerobic activities Hypertension.  Suspect.  He has had some borderline high readings.  He has been asked to monitor blood pressure readings a bit more often.  He will notify the office if blood pressures are consistently greater than 140 over 90.  Otherwise, or reassess in 6 months  Medications updated

## 2015-01-21 NOTE — Progress Notes (Signed)
Pre visit review using our clinic review tool, if applicable. No additional management support is needed unless otherwise documented below in the visit note. 

## 2015-01-22 ENCOUNTER — Ambulatory Visit: Payer: Commercial Managed Care - PPO | Admitting: Internal Medicine

## 2015-02-22 ENCOUNTER — Telehealth: Payer: Self-pay | Admitting: Internal Medicine

## 2015-02-22 NOTE — Telephone Encounter (Signed)
I called and spoke to pharmacy and was advised this is not a PA issue, it is an insurance issue.  The pharmacy has already spoken to the patient about this issue.

## 2015-02-22 NOTE — Telephone Encounter (Signed)
Pt following up on prior auth for his  amphetamine-dextroamphetamine (ADDERALL XR) 30 MG 24 hr capsule  [facility]/ [address]  Pt is out of his med

## 2015-02-23 NOTE — Telephone Encounter (Signed)
Pt states that the insurance company is saying that PCP needs to call [phone] and provided medical information such as hx and need for medication, and request an "urgent review." After that is complete, then the insurance company is supposed to tell the pharmacy it's ok to fill the rx.

## 2015-02-23 NOTE — Telephone Encounter (Signed)
I spoke to the pharmacy again and was advised that the medication does not require a PA.  There is rejection on the insurance end that is not causing a PA.   I can't submit a PA per the pharmacy since it is not creating a PA rejection.  The pharmacy also advised me that they have explained this to the patient multiple times.     I tried to submit a PA yesterday and was told the plan is inactive and couldn't submit a PA.

## 2015-02-23 NOTE — Telephone Encounter (Signed)
Pt will call pharm to try to get to bottom on med rejection

## 2015-02-23 NOTE — Telephone Encounter (Signed)
I called the number the patient left for [facility]. Pt was being declined med due to a max age on his policy of 4719. I called and initiated a PA [id number]). Provided clinical information and asked that the askde it urgent. It was marked urgent and there will be a response by Thursday 07/14.

## 2015-02-24 NOTE — Telephone Encounter (Signed)
PA approved and pharmacy was contacted.

## 2015-05-04 ENCOUNTER — Telehealth: Payer: Self-pay | Admitting: Internal Medicine

## 2015-05-04 MED ORDER — AMPHETAMINE-DEXTROAMPHET ER 30 MG PO CP24: 30.0000 mg | ORAL_CAPSULE | Freq: Every day | ORAL | Status: DC

## 2015-05-04 NOTE — Telephone Encounter (Signed)
Pt request refill amphetamine-dextroamphetamine (ADDERALL XR) 30 MG 24 hr capsule 3 mo supply  Pt saw Dr Kirtland Bouchard  Last visit and filled last time

## 2015-05-04 NOTE — Telephone Encounter (Signed)
Pt notified Rx ready for pickup. Rx printed and signed.  

## 2015-07-28 ENCOUNTER — Telehealth: Payer: Self-pay | Admitting: Internal Medicine

## 2015-07-28 NOTE — Telephone Encounter (Signed)
° ° ° ° ° °  Pt request refill of the following: ° °amphetamine-dextroamphetamine (ADDERALL XR) 30 MG 24 hr capsule ° ° °Phamacy: °

## 2015-07-28 NOTE — Telephone Encounter (Signed)
He needs to make appt with another provider for RF.  Ok to authorize 1 RF if he has difficulty scheduling or he is completely out.

## 2015-07-29 NOTE — Telephone Encounter (Signed)
Patient is aware and an appointment is made

## 2015-07-30 ENCOUNTER — Encounter: Payer: Self-pay | Admitting: Family Medicine

## 2015-07-30 ENCOUNTER — Ambulatory Visit (INDEPENDENT_AMBULATORY_CARE_PROVIDER_SITE_OTHER): Payer: Commercial Managed Care - PPO | Admitting: Family Medicine

## 2015-07-30 VITALS — BP 120/70 | HR 98 | Temp 98.0°F | Resp 16 | Ht 74.0 in | Wt 252.0 lb

## 2015-07-30 DIAGNOSIS — F909 Attention-deficit hyperactivity disorder, unspecified type: Secondary | ICD-10-CM

## 2015-07-30 DIAGNOSIS — Z23 Encounter for immunization: Secondary | ICD-10-CM

## 2015-07-30 DIAGNOSIS — F988 Other specified behavioral and emotional disorders with onset usually occurring in childhood and adolescence: Secondary | ICD-10-CM

## 2015-07-30 MED ORDER — AMPHETAMINE-DEXTROAMPHET ER 30 MG PO CP24: ORAL_CAPSULE | ORAL | Status: DC

## 2015-07-30 MED ORDER — AMPHETAMINE-DEXTROAMPHET ER 30 MG PO CP24: 30.0000 mg | ORAL_CAPSULE | Freq: Every day | ORAL | Status: DC

## 2015-07-30 NOTE — Progress Notes (Signed)
Pre visit review using our clinic review tool, if applicable. No additional management support is needed unless otherwise documented below in the visit note. 

## 2015-07-30 NOTE — Progress Notes (Signed)
   Subjective:    Patient ID: Daniel Gardner, male    DOB: 08-26-1980, 34 y.o.   MRN: [id number]  HPI Medical follow-up for attention deficit disorder Takes Adderall XR 30 mg once daily Recent increase in dosage from 20-30 mg and that seemed to work well for him. No problems with medication. No headaches. No insomnia. Works in Data processing managerlogistics. This is helped tremendously with his focus. Requesting refills today. Also needs flu vaccine. Otherwise healthy with no chronic medical problems.  Past Medical History  Diagnosis Date  . GERD (gastroesophageal reflux disease)     OTC prn  . Umbilical hernia 08/2012  . ADD (attention deficit disorder)   . Blister of finger 08/29/2012    left ring  . Sty 08/29/2012    of eye, will finish med. 08/31/2012   Past Surgical History  Procedure Laterality Date  . Dental surgery  as a child    X 2 - for tooth extraction  . Umbilical hernia repair  09/05/2012    Procedure: HERNIA REPAIR UMBILICAL ADULT;  Surgeon: Wilmon ArmsMatthew K. Corliss Skainssuei, MD;  Location: [facility];  Service: General;  Laterality: N/A;  umbilical hernia repair     reports that he quit smoking about 4 years ago. He quit smokeless tobacco use about 4 years ago. He reports that he drinks alcohol. He reports that he does not use illicit drugs. family history includes Cancer - Colon in his maternal grandfather and maternal grandmother; Diabetes in his father; Hyperlipidemia in his father; Hypertension in his father and mother. Allergies  Allergen Reactions  . Sulfa Antibiotics Other (See Comments)    UNKNOWN REACTION - WAS AS A CHILD      Review of Systems  Constitutional: Negative for fatigue.  Eyes: Negative for visual disturbance.  Respiratory: Negative for cough, chest tightness and shortness of breath.   Cardiovascular: Negative for chest pain, palpitations and leg swelling.  Endocrine: Negative for polydipsia and polyuria.  Neurological: Negative for dizziness, syncope,  weakness, light-headedness and headaches.       Objective:   Physical Exam  Constitutional: He appears well-developed and well-nourished.  Neck: Neck supple. No thyromegaly present.  Cardiovascular: Normal rate and regular rhythm.   No murmur heard. Pulmonary/Chest: Effort normal and breath sounds normal. No respiratory distress. He has no wheezes. He has no rales.  Musculoskeletal: He exhibits no edema.          Assessment & Plan:  ADD. Stable. Refill Adderall XR 30 mg daily for 3 months. Flu vaccine given.

## 2015-07-30 NOTE — Addendum Note (Signed)
Addended by: Tempie HoistMCNEIL, Christian Borgerding M on: 07/30/2015 01:35 PM   Modules accepted: Orders

## 2015-11-01 ENCOUNTER — Telehealth: Payer: Self-pay | Admitting: Internal Medicine

## 2015-11-01 NOTE — Telephone Encounter (Signed)
Pt needs new rx generic adderall xr 30 mg °

## 2015-11-02 NOTE — Telephone Encounter (Signed)
Refill once 

## 2015-11-02 NOTE — Telephone Encounter (Signed)
Fleet ContrasRachel, do you think Dr Caryl NeverBurchette will write this rx for pt? Pt states he normally is seen every 6 months, and was just seen in Dec by dr Caryl NeverBurchette.  amphetamine-dextroamphetamine (ADDERALL XR) 30 MG 24 hr capsule

## 2015-11-03 MED ORDER — AMPHETAMINE-DEXTROAMPHET ER 30 MG PO CP24: 30.0000 mg | ORAL_CAPSULE | Freq: Every day | ORAL | Status: DC

## 2015-11-03 NOTE — Telephone Encounter (Signed)
Pt is aware refill once

## 2015-11-03 NOTE — Telephone Encounter (Signed)
rx ready for pick up and patient is aware.  Patient is aware that he should establish with a new provider.

## 2016-09-25 ENCOUNTER — Ambulatory Visit (INDEPENDENT_AMBULATORY_CARE_PROVIDER_SITE_OTHER): Payer: Commercial Managed Care - PPO | Admitting: Diagnostic Neuroimaging

## 2016-09-25 ENCOUNTER — Encounter: Payer: Self-pay | Admitting: *Deleted

## 2016-09-25 VITALS — BP 137/88 | HR 83 | Ht 74.0 in | Wt 262.8 lb

## 2016-09-25 DIAGNOSIS — R200 Anesthesia of skin: Secondary | ICD-10-CM

## 2016-09-25 DIAGNOSIS — M79602 Pain in left arm: Secondary | ICD-10-CM

## 2016-09-25 DIAGNOSIS — R208 Other disturbances of skin sensation: Secondary | ICD-10-CM | POA: Diagnosis not present

## 2016-09-25 NOTE — Progress Notes (Signed)
[facility]  PATIENT: Daniel Gardner DOB: 11-Sep-1980  REFERRING CLINICIAN: Nicholos Johns HISTORY FROM: patient  REASON FOR VISIT: new consult    HISTORICAL  CHIEF COMPLAINT:  Chief Complaint  Patient presents with  . Left arm paresthesia    rm 7, New Pt, [name], "pain in L hand, thumb immediately following blood draw; it has not improved"    HISTORY OF PRESENT ILLNESS:   36 year old right-handed male here for evaluation of left hand/thumb pain. 09/15/16 patient had a left antebrachial fossa venipuncture and immediately experienced shooting hot pain in his left thumb. Patient completed his blood draw testing and went home. The initial pain improved within 10-15 minutes. Within 1-2 days he developed intermittent tightness, pulling sensation, uncomfortable feeling sensation in his left distal lateral wrist and left thumb. Symptoms seem to be worse when his elbow was in extension. No problems with his left arm proximal to his left elbow. No definite grip weakness. No problems with his right arm. No problems with his legs.  Due to persistent symptoms patient went to PCP for evaluation. Mild difficulty with left hand thumb to fifth digit opposition was noted. Patient was referred here for further evaluation.  Overall symptoms are not worsening significantly over time. They have been fairly stable. Symptoms worse when he is exercising, bending and extending his elbow, or doing other physical activity. He has noticed problems when doing barbell curls or playing golf.  No other history of injury or trauma to the left upper extremity. No other symptoms of numbness or weakness in the past.   REVIEW OF SYSTEMS: Full 14 system review of systems performed and negative with exception of: Mild numbness and weakness in left hand.  ALLERGIES: Allergies  Allergen Reactions  . Shellfish Allergy   . Sulfa Antibiotics Other (See Comments)    UNKNOWN REACTION - WAS AS  A CHILD    HOME MEDICATIONS: Outpatient Medications Prior to Visit  Medication Sig Dispense Refill  . amphetamine-dextroamphetamine (ADDERALL XR) 30 MG 24 hr capsule Take one capsule daily.  May refill in two months. 30 capsule 0  . EPINEPHrine (EPIPEN 2-PAK) 0.3 mg/0.3 mL IJ SOAJ injection Inject 0.3 mLs (0.3 mg total) into the muscle once. 1 Device 5  . omeprazole (PRILOSEC) 20 MG capsule Take 20 mg by mouth daily as needed.     Marland Kitchen amphetamine-dextroamphetamine (ADDERALL XR) 30 MG 24 hr capsule Take one capsule daily.  May refill in one month. 30 capsule 0  . amphetamine-dextroamphetamine (ADDERALL XR) 30 MG 24 hr capsule Take 1 capsule (30 mg total) by mouth daily. 30 capsule 0   No facility-administered medications prior to visit.     PAST MEDICAL HISTORY: Past Medical History:  Diagnosis Date  . ADD (attention deficit disorder)   . Blister of finger 08/29/2012   left ring  . GERD (gastroesophageal reflux disease)    OTC prn  . Sty 08/29/2012   of eye, will finish med. 08/31/2012  . Umbilical hernia 08/2012    PAST SURGICAL HISTORY: Past Surgical History:  Procedure Laterality Date  . DENTAL SURGERY  as a child   X 2 - for tooth extraction  . UMBILICAL HERNIA REPAIR  09/05/2012   Procedure: HERNIA REPAIR UMBILICAL ADULT;  Surgeon: Wilmon Arms. Corliss Skains, MD;  Location: [facility];  Service: General;  Laterality: N/A;  umbilical hernia repair     FAMILY HISTORY: Family History  Problem Relation Age of Onset  . Hypertension Mother   .  Hypertension Father   . Hyperlipidemia Father   . Diabetes Father   . Cancer - Colon Maternal Grandmother   . Hypertension Maternal Grandmother   . Cancer - Colon Maternal Grandfather   . Hypertension Maternal Grandfather   . Multiple sclerosis Maternal Aunt   . Hypertension Paternal Grandmother   . Hypertension Paternal Grandfather     SOCIAL HISTORY:  Social History   Social History  . Marital status: Married    Spouse  name: [name]  . Number of children: 0  . Years of education: 15   Occupational History  .      [employer]  .      student   Social History Main Topics  . Smoking status: Former Smoker    Quit date: 08/14/2010  . Smokeless tobacco: Never Used  . Alcohol use Yes     Comment: occasionally  . Drug use: No  . Sexual activity: Not on file   Other Topics Concern  . Not on file   Social History Narrative   Lives with wife   Caffeine- coffee, 1-2 cups daily     PHYSICAL EXAM  GENERAL EXAM/CONSTITUTIONAL: Vitals:  Vitals:   09/25/16 1136  BP: 137/88  Pulse: 83  Weight: 262 lb 12.8 oz (119.2 kg)  Height: 6\' 2"  (1.88 m)     Body mass index is 33.74 kg/m.  Visual Acuity Screening   Right eye Left eye Both eyes  Without correction:     With correction: 20/20 20/30      Patient is in no distress; well developed, nourished and groomed; neck is supple  CARDIOVASCULAR:  Examination of carotid arteries is normal; no carotid bruits  Regular rate and rhythm, no murmurs  Examination of peripheral vascular system by observation and palpation is normal  EYES:  Ophthalmoscopic exam of optic discs and posterior segments is normal; no papilledema or hemorrhages  MUSCULOSKELETAL:  Gait, strength, tone, movements noted in Neurologic exam below  NEUROLOGIC: MENTAL STATUS:  No flowsheet data found.  awake, alert, oriented to person, place and time  recent and remote memory intact  normal attention and concentration  language fluent, comprehension intact, naming intact,   fund of knowledge appropriate  CRANIAL NERVE:   2nd - no papilledema on fundoscopic exam  2nd, 3rd, 4th, 6th - pupils equal and reactive to light, visual fields full to confrontation, extraocular muscles intact, no nystagmus  5th - facial sensation symmetric  7th - facial strength symmetric  8th - hearing intact  9th - palate elevates symmetrically, uvula midline  11th - shoulder shrug  symmetric  12th - tongue protrusion midline  MOTOR:   normal bulk and tone, full strength in the BUE, BLE EXCEPT:  LEFT HAND THUMB TO 5TH DIGIT OPPOSITION --> MILD WEAKNESS OF 5TH DIGIT OPPOSITION VS DECR ROM.  NEG TINEL'S  LEFT THUMB STRENGTH NORMAL  SENSORY:   normal and symmetric to light touch, pinprick, temperature, vibration  COORDINATION:   finger-nose-finger, fine finger movements normal  REFLEXES:   deep tendon reflexes present and symmetric  GAIT/STATION:   narrow based gait; able to walk tandem; romberg is negative    DIAGNOSTIC DATA (LABS, IMAGING, TESTING) - I reviewed patient records, labs, notes, testing and imaging myself where available.  Lab Results  Component Value Date   WBC 8.2 07/03/2012   HGB 15.2 09/05/2012   HCT 44.1 07/03/2012   MCV 96.6 07/03/2012   PLT 249.0 07/03/2012      Component Value Date/Time  NA 134 (L) 07/03/2012 1411   K 3.8 07/03/2012 1411   CL 99 07/03/2012 1411   CO2 29 07/03/2012 1411   GLUCOSE 89 07/03/2012 1411   BUN 16 07/03/2012 1411   CREATININE 1.1 07/03/2012 1411   CALCIUM 9.2 07/03/2012 1411   PROT 7.5 07/03/2012 1411   ALBUMIN 4.2 07/03/2012 1411   AST 24 07/03/2012 1411   ALT 41 07/03/2012 1411   ALKPHOS 60 07/03/2012 1411   BILITOT 0.7 07/03/2012 1411   Lab Results  Component Value Date   CHOL 230 (H) 07/03/2012   HDL 43.20 07/03/2012   LDLDIRECT 140.9 07/03/2012   TRIG 348.0 (H) 07/03/2012   CHOLHDL 5 07/03/2012   No results found for: HGBA1C No results found for: VITAMINB12 Lab Results  Component Value Date   TSH 1.38 07/03/2012        ASSESSMENT AND PLAN  36 y.o. year old male here with left thumb/wrist numbness, pain and left 5th digit (opposition) weakness immediately following venipuncture in the left antebrachial fossa. Patient may have developed mild venipuncture or tourniquet associated neuritis. The sensory symptoms correlate with the left superficial sensory nerve. However  the left hand 5th digit opposition would correlate to the deep branch of the ulnar nerve, which is not likely to be affected by the site of patient's venipuncture or tourniquet, and therefore of unclear etiology. Overall symptoms are mild and stable, 24 days old, and too early for electrodiagnostic testing to be helpful.   Ddx: mild left superficial radial sensory neuritis (post-venipuncture at antecubital fossa)  1. Numbness of left thumb   2. Left arm pain      PLAN: - Advised patient to pursue conservative management for at least next 4 to 6 weeks, avoiding excessive exertion or pressure at the left elbow or left forearm.  - May use ice as needed for symptom management.  - If symptoms significantly worsen or fail to improve after 4-6 weeks, advised patient to follow-up in our clinic for possible nerve conduction EMG and other testing.  Return if symptoms worsen or fail to improve, for return to PCP.    Suanne Marker, MD 09/25/2016, 12:09 PM Certified in Neurology, Neurophysiology and Neuroimaging  [facility] [address], [address] [place], [place] [address] [phone]

## 2018-10-02 ENCOUNTER — Ambulatory Visit (INDEPENDENT_AMBULATORY_CARE_PROVIDER_SITE_OTHER): Payer: 59 | Admitting: Psychology

## 2018-10-02 DIAGNOSIS — F4323 Adjustment disorder with mixed anxiety and depressed mood: Secondary | ICD-10-CM | POA: Diagnosis not present

## 2018-10-02 DIAGNOSIS — F900 Attention-deficit hyperactivity disorder, predominantly inattentive type: Secondary | ICD-10-CM | POA: Diagnosis not present

## 2018-10-16 ENCOUNTER — Ambulatory Visit (INDEPENDENT_AMBULATORY_CARE_PROVIDER_SITE_OTHER): Payer: 59 | Admitting: Psychology

## 2018-10-16 DIAGNOSIS — F4323 Adjustment disorder with mixed anxiety and depressed mood: Secondary | ICD-10-CM | POA: Diagnosis not present

## 2018-10-30 ENCOUNTER — Ambulatory Visit (INDEPENDENT_AMBULATORY_CARE_PROVIDER_SITE_OTHER): Payer: 59 | Admitting: Psychology

## 2018-10-30 DIAGNOSIS — F4324 Adjustment disorder with disturbance of conduct: Secondary | ICD-10-CM | POA: Diagnosis not present

## 2018-10-30 DIAGNOSIS — F900 Attention-deficit hyperactivity disorder, predominantly inattentive type: Secondary | ICD-10-CM

## 2018-11-13 ENCOUNTER — Ambulatory Visit (INDEPENDENT_AMBULATORY_CARE_PROVIDER_SITE_OTHER): Payer: 59 | Admitting: Psychology

## 2018-11-13 DIAGNOSIS — F4324 Adjustment disorder with disturbance of conduct: Secondary | ICD-10-CM

## 2018-11-13 DIAGNOSIS — F900 Attention-deficit hyperactivity disorder, predominantly inattentive type: Secondary | ICD-10-CM | POA: Diagnosis not present

## 2018-11-27 ENCOUNTER — Ambulatory Visit (INDEPENDENT_AMBULATORY_CARE_PROVIDER_SITE_OTHER): Payer: 59 | Admitting: Psychology

## 2018-11-27 DIAGNOSIS — F900 Attention-deficit hyperactivity disorder, predominantly inattentive type: Secondary | ICD-10-CM | POA: Diagnosis not present

## 2018-11-27 DIAGNOSIS — F4323 Adjustment disorder with mixed anxiety and depressed mood: Secondary | ICD-10-CM | POA: Diagnosis not present

## 2018-12-11 ENCOUNTER — Ambulatory Visit (INDEPENDENT_AMBULATORY_CARE_PROVIDER_SITE_OTHER): Payer: 59 | Admitting: Psychology

## 2018-12-11 DIAGNOSIS — F900 Attention-deficit hyperactivity disorder, predominantly inattentive type: Secondary | ICD-10-CM | POA: Diagnosis not present

## 2018-12-11 DIAGNOSIS — F4323 Adjustment disorder with mixed anxiety and depressed mood: Secondary | ICD-10-CM | POA: Diagnosis not present

## 2018-12-25 ENCOUNTER — Ambulatory Visit (INDEPENDENT_AMBULATORY_CARE_PROVIDER_SITE_OTHER): Payer: 59 | Admitting: Psychology

## 2018-12-25 DIAGNOSIS — F900 Attention-deficit hyperactivity disorder, predominantly inattentive type: Secondary | ICD-10-CM | POA: Diagnosis not present

## 2018-12-25 DIAGNOSIS — F4323 Adjustment disorder with mixed anxiety and depressed mood: Secondary | ICD-10-CM | POA: Diagnosis not present

## 2019-01-10 ENCOUNTER — Ambulatory Visit (INDEPENDENT_AMBULATORY_CARE_PROVIDER_SITE_OTHER): Payer: 59 | Admitting: Psychology

## 2019-01-10 DIAGNOSIS — F4323 Adjustment disorder with mixed anxiety and depressed mood: Secondary | ICD-10-CM

## 2019-01-10 DIAGNOSIS — F900 Attention-deficit hyperactivity disorder, predominantly inattentive type: Secondary | ICD-10-CM | POA: Diagnosis not present

## 2019-01-29 ENCOUNTER — Ambulatory Visit (INDEPENDENT_AMBULATORY_CARE_PROVIDER_SITE_OTHER): Payer: 59 | Admitting: Psychology

## 2019-01-29 DIAGNOSIS — F900 Attention-deficit hyperactivity disorder, predominantly inattentive type: Secondary | ICD-10-CM

## 2019-01-29 DIAGNOSIS — F4323 Adjustment disorder with mixed anxiety and depressed mood: Secondary | ICD-10-CM

## 2019-02-19 ENCOUNTER — Ambulatory Visit (INDEPENDENT_AMBULATORY_CARE_PROVIDER_SITE_OTHER): Payer: 59 | Admitting: Psychology

## 2019-02-19 DIAGNOSIS — F4323 Adjustment disorder with mixed anxiety and depressed mood: Secondary | ICD-10-CM

## 2019-02-19 DIAGNOSIS — F900 Attention-deficit hyperactivity disorder, predominantly inattentive type: Secondary | ICD-10-CM

## 2019-03-19 ENCOUNTER — Ambulatory Visit: Payer: 59 | Admitting: Psychology

## 2022-10-26 ENCOUNTER — Emergency Department (HOSPITAL_BASED_OUTPATIENT_CLINIC_OR_DEPARTMENT_OTHER): Payer: 59

## 2022-10-26 ENCOUNTER — Encounter (HOSPITAL_BASED_OUTPATIENT_CLINIC_OR_DEPARTMENT_OTHER): Payer: Self-pay | Admitting: Pediatrics

## 2022-10-26 ENCOUNTER — Other Ambulatory Visit: Payer: Self-pay

## 2022-10-26 ENCOUNTER — Inpatient Hospital Stay (HOSPITAL_BASED_OUTPATIENT_CLINIC_OR_DEPARTMENT_OTHER)
Admission: EM | Admit: 2022-10-26 | Discharge: 2022-10-29 | DRG: 871 | Disposition: A | Discharge status: Home / Self Care | Payer: 59 | Attending: Internal Medicine | Admitting: Internal Medicine

## 2022-10-26 ENCOUNTER — Inpatient Hospital Stay (HOSPITAL_COMMUNITY): Payer: 59

## 2022-10-26 DIAGNOSIS — R601 Generalized edema: Secondary | ICD-10-CM | POA: Diagnosis present

## 2022-10-26 DIAGNOSIS — Z83438 Family history of other disorder of lipoprotein metabolism and other lipidemia: Secondary | ICD-10-CM | POA: Diagnosis not present

## 2022-10-26 DIAGNOSIS — R112 Nausea with vomiting, unspecified: Secondary | ICD-10-CM | POA: Diagnosis present

## 2022-10-26 DIAGNOSIS — R55 Syncope and collapse: Secondary | ICD-10-CM | POA: Diagnosis present

## 2022-10-26 DIAGNOSIS — E669 Obesity, unspecified: Secondary | ICD-10-CM | POA: Diagnosis present

## 2022-10-26 DIAGNOSIS — K219 Gastro-esophageal reflux disease without esophagitis: Secondary | ICD-10-CM | POA: Diagnosis present

## 2022-10-26 DIAGNOSIS — Z79899 Other long term (current) drug therapy: Secondary | ICD-10-CM | POA: Diagnosis not present

## 2022-10-26 DIAGNOSIS — Z833 Family history of diabetes mellitus: Secondary | ICD-10-CM | POA: Diagnosis not present

## 2022-10-26 DIAGNOSIS — I10 Essential (primary) hypertension: Secondary | ICD-10-CM | POA: Diagnosis present

## 2022-10-26 DIAGNOSIS — J189 Pneumonia, unspecified organism: Secondary | ICD-10-CM | POA: Diagnosis present

## 2022-10-26 DIAGNOSIS — F909 Attention-deficit hyperactivity disorder, unspecified type: Secondary | ICD-10-CM | POA: Diagnosis not present

## 2022-10-26 DIAGNOSIS — Z8249 Family history of ischemic heart disease and other diseases of the circulatory system: Secondary | ICD-10-CM | POA: Diagnosis not present

## 2022-10-26 DIAGNOSIS — Z820 Family history of epilepsy and other diseases of the nervous system: Secondary | ICD-10-CM

## 2022-10-26 DIAGNOSIS — Z882 Allergy status to sulfonamides status: Secondary | ICD-10-CM | POA: Diagnosis not present

## 2022-10-26 DIAGNOSIS — T380X5A Adverse effect of glucocorticoids and synthetic analogues, initial encounter: Secondary | ICD-10-CM | POA: Diagnosis not present

## 2022-10-26 DIAGNOSIS — M62830 Muscle spasm of back: Secondary | ICD-10-CM | POA: Diagnosis present

## 2022-10-26 DIAGNOSIS — F988 Other specified behavioral and emotional disorders with onset usually occurring in childhood and adolescence: Secondary | ICD-10-CM | POA: Diagnosis present

## 2022-10-26 DIAGNOSIS — Z91013 Allergy to seafood: Secondary | ICD-10-CM | POA: Diagnosis not present

## 2022-10-26 DIAGNOSIS — R6520 Severe sepsis without septic shock: Secondary | ICD-10-CM | POA: Diagnosis present

## 2022-10-26 DIAGNOSIS — E875 Hyperkalemia: Secondary | ICD-10-CM | POA: Diagnosis present

## 2022-10-26 DIAGNOSIS — Z6834 Body mass index (BMI) 34.0-34.9, adult: Secondary | ICD-10-CM | POA: Diagnosis not present

## 2022-10-26 DIAGNOSIS — R531 Weakness: Secondary | ICD-10-CM | POA: Diagnosis present

## 2022-10-26 DIAGNOSIS — R739 Hyperglycemia, unspecified: Secondary | ICD-10-CM | POA: Diagnosis not present

## 2022-10-26 DIAGNOSIS — Z87891 Personal history of nicotine dependence: Secondary | ICD-10-CM | POA: Diagnosis not present

## 2022-10-26 DIAGNOSIS — A419 Sepsis, unspecified organism: Principal | ICD-10-CM | POA: Diagnosis present

## 2022-10-26 DIAGNOSIS — Z1152 Encounter for screening for COVID-19: Secondary | ICD-10-CM

## 2022-10-26 LAB — CBC WITH DIFFERENTIAL/PLATELET
Abs Immature Granulocytes: 0.06 10*3/uL (ref 0.00–0.07)
Basophils Absolute: 0 10*3/uL (ref 0.0–0.1)
Basophils Relative: 0 %
Eosinophils Absolute: 0 10*3/uL (ref 0.0–0.5)
Eosinophils Relative: 0 %
HCT: 48.6 % (ref 39.0–52.0)
Hemoglobin: 16.2 g/dL (ref 13.0–17.0)
Immature Granulocytes: 0 %
Lymphocytes Relative: 4 %
Lymphs Abs: 0.5 10*3/uL — ABNORMAL LOW (ref 0.7–4.0)
MCH: 31 pg (ref 26.0–34.0)
MCHC: 33.3 g/dL (ref 30.0–36.0)
MCV: 92.9 fL (ref 80.0–100.0)
Monocytes Absolute: 0.4 10*3/uL (ref 0.1–1.0)
Monocytes Relative: 3 %
Neutro Abs: 13.6 10*3/uL — ABNORMAL HIGH (ref 1.7–7.7)
Neutrophils Relative %: 93 %
Platelets: 250 10*3/uL (ref 150–400)
RBC: 5.23 MIL/uL (ref 4.22–5.81)
RDW: 12.7 % (ref 11.5–15.5)
WBC: 14.6 10*3/uL — ABNORMAL HIGH (ref 4.0–10.5)
nRBC: 0 % (ref 0.0–0.2)

## 2022-10-26 LAB — URINALYSIS, ROUTINE W REFLEX MICROSCOPIC
Bilirubin Urine: NEGATIVE
Glucose, UA: NEGATIVE mg/dL
Hgb urine dipstick: NEGATIVE
Ketones, ur: 80 mg/dL — AB
Leukocytes,Ua: NEGATIVE
Nitrite: NEGATIVE
Protein, ur: 30 mg/dL — AB
Specific Gravity, Urine: 1.02 (ref 1.005–1.030)
pH: 7 (ref 5.0–8.0)

## 2022-10-26 LAB — D-DIMER, QUANTITATIVE: D-Dimer, Quant: <0.27 ug/mL-FEU (ref 0.00–0.50)

## 2022-10-26 LAB — COMPREHENSIVE METABOLIC PANEL
ALT: 45 U/L — ABNORMAL HIGH (ref 0–44)
AST: 28 U/L (ref 15–41)
Albumin: 4.6 g/dL (ref 3.5–5.0)
Alkaline Phosphatase: 63 U/L (ref 38–126)
Anion gap: 11 (ref 5–15)
BUN: 18 mg/dL (ref 6–20)
CO2: 22 mmol/L (ref 22–32)
Calcium: 9 mg/dL (ref 8.9–10.3)
Chloride: 100 mmol/L (ref 98–111)
Creatinine, Ser: 1.1 mg/dL (ref 0.61–1.24)
GFR, Estimated: >60 mL/min (ref >60)
Glucose, Bld: 137 mg/dL — ABNORMAL HIGH (ref 70–99)
Potassium: 5.4 mmol/L — ABNORMAL HIGH (ref 3.5–5.1)
Sodium: 133 mmol/L — ABNORMAL LOW (ref 135–145)
Total Bilirubin: 1.3 mg/dL — ABNORMAL HIGH (ref 0.3–1.2)
Total Protein: 8 g/dL (ref 6.5–8.1)

## 2022-10-26 LAB — URINALYSIS, MICROSCOPIC (REFLEX)

## 2022-10-26 LAB — PROTIME-INR
INR: 1 (ref 0.8–1.2)
Prothrombin Time: 13.1 seconds (ref 11.4–15.2)

## 2022-10-26 LAB — RESP PANEL BY RT-PCR (RSV, FLU A&B, COVID)  RVPGX2
Influenza A by PCR: NEGATIVE
Influenza B by PCR: NEGATIVE
Resp Syncytial Virus by PCR: NEGATIVE
SARS Coronavirus 2 by RT PCR: NEGATIVE

## 2022-10-26 LAB — LACTIC ACID, PLASMA
Lactic Acid, Venous: 1.3 mmol/L (ref 0.5–1.9)
Lactic Acid, Venous: 3.3 mmol/L (ref 0.5–1.9)
Lactic Acid, Venous: 1.4 mmol/L (ref 0.5–1.9)

## 2022-10-26 LAB — CBG MONITORING, ED: Glucose-Capillary: 123 mg/dL — ABNORMAL HIGH (ref 70–99)

## 2022-10-26 LAB — APTT: aPTT: 24 seconds (ref 24–36)

## 2022-10-26 MED ORDER — ACETAMINOPHEN 500 MG PO TABS: 1000.0000 mg | ORAL_TABLET | Freq: Four times a day (QID) | ORAL | Status: DC | PRN

## 2022-10-26 MED ORDER — LACTATED RINGERS IV BOLUS (SEPSIS)
1000.0000 mL | Freq: Once | INTRAVENOUS | Status: AC
  Administered 2022-10-26: 1000 mL via INTRAVENOUS

## 2022-10-26 MED ORDER — DIPHENHYDRAMINE HCL 50 MG/ML IJ SOLN
25.0000 mg | INTRAMUSCULAR | Status: AC
  Administered 2022-10-26: 25 mg via INTRAVENOUS
  Filled 2022-10-26: qty 1

## 2022-10-26 MED ORDER — METHYLPREDNISOLONE SODIUM SUCC 125 MG IJ SOLR
125.0000 mg | INTRAMUSCULAR | Status: AC
  Administered 2022-10-26: 125 mg via INTRAVENOUS
  Filled 2022-10-26: qty 2

## 2022-10-26 MED ORDER — SODIUM CHLORIDE 0.9 % IV SOLN
1.0000 g | Freq: Once | INTRAVENOUS | Status: AC
  Administered 2022-10-26: 1 g via INTRAVENOUS
  Filled 2022-10-26: qty 10

## 2022-10-26 MED ORDER — SODIUM CHLORIDE 0.9 % IV SOLN
500.0000 mg | INTRAVENOUS | Status: DC
  Administered 2022-10-26: 500 mg via INTRAVENOUS
  Filled 2022-10-26: qty 5

## 2022-10-26 MED ORDER — AZITHROMYCIN 250 MG PO TABS
500.0000 mg | ORAL_TABLET | Freq: Every day | ORAL | Status: DC
  Administered 2022-10-27 – 2022-10-29 (×3): 500 mg via ORAL
  Filled 2022-10-26 (×3): qty 2

## 2022-10-26 MED ORDER — LABETALOL HCL 5 MG/ML IV SOLN: 10.0000 mg | INTRAVENOUS | Status: DC | PRN

## 2022-10-26 MED ORDER — ONDANSETRON HCL 4 MG/2ML IJ SOLN
4.0000 mg | Freq: Once | INTRAMUSCULAR | Status: AC
  Administered 2022-10-26: 4 mg via INTRAVENOUS
  Filled 2022-10-26: qty 2

## 2022-10-26 MED ORDER — SODIUM CHLORIDE 0.9 % IV SOLN: INTRAVENOUS | Status: AC

## 2022-10-26 MED ORDER — PROCHLORPERAZINE EDISYLATE 10 MG/2ML IJ SOLN
10.0000 mg | Freq: Once | INTRAMUSCULAR | Status: AC
  Administered 2022-10-26: 10 mg via INTRAVENOUS
  Filled 2022-10-26: qty 2

## 2022-10-26 MED ORDER — AMLODIPINE BESYLATE 10 MG PO TABS
10.0000 mg | ORAL_TABLET | Freq: Every day | ORAL | Status: DC
  Administered 2022-10-27 – 2022-10-29 (×3): 10 mg via ORAL
  Filled 2022-10-26 (×3): qty 1

## 2022-10-26 MED ORDER — MELATONIN 5 MG PO TABS: 10.0000 mg | ORAL_TABLET | Freq: Every evening | ORAL | Status: DC | PRN

## 2022-10-26 MED ORDER — LACTATED RINGERS IV BOLUS: 1000.0000 mL | Freq: Once | INTRAVENOUS | Status: DC

## 2022-10-26 MED ORDER — HEPARIN SODIUM (PORCINE) 5000 UNIT/ML IJ SOLN
5000.0000 [IU] | Freq: Three times a day (TID) | INTRAMUSCULAR | Status: DC
  Administered 2022-10-26 – 2022-10-29 (×8): 5000 [IU] via SUBCUTANEOUS
  Filled 2022-10-26 (×8): qty 1

## 2022-10-26 MED ORDER — SODIUM CHLORIDE 0.9 % IV BOLUS
500.0000 mL | Freq: Once | INTRAVENOUS | Status: AC
  Administered 2022-10-26: 500 mL via INTRAVENOUS

## 2022-10-26 MED ORDER — IOHEXOL 350 MG/ML SOLN
75.0000 mL | Freq: Once | INTRAVENOUS | Status: AC | PRN
  Administered 2022-10-26: 75 mL via INTRAVENOUS

## 2022-10-26 MED ORDER — ACETAMINOPHEN 500 MG PO TABS
1000.0000 mg | ORAL_TABLET | Freq: Once | ORAL | Status: AC
  Administered 2022-10-26: 1000 mg via ORAL
  Filled 2022-10-26: qty 2

## 2022-10-26 MED ORDER — PROCHLORPERAZINE EDISYLATE 10 MG/2ML IJ SOLN: 10.0000 mg | Freq: Four times a day (QID) | INTRAMUSCULAR | Status: DC | PRN

## 2022-10-26 MED ORDER — SODIUM CHLORIDE 0.9 % IV SOLN
2.0000 g | INTRAVENOUS | Status: DC
  Administered 2022-10-27 – 2022-10-28 (×2): 2 g via INTRAVENOUS
  Filled 2022-10-26 (×2): qty 20

## 2022-10-26 NOTE — ED Triage Notes (Signed)
Reports generalized weakness came on suddenly while waiting at the airport; c/o vomiting x 3; back spasms, generalized aches. Reports "hard to breathe";

## 2022-10-26 NOTE — Assessment & Plan Note (Signed)
On adderalll at home. Hold during hospitalization.

## 2022-10-26 NOTE — H&P (Signed)
History and Physical    Daniel Gardner [id number] DOB: 03/30/1981 DOA: 10/26/2022  DOS: the patient was seen and examined on 10/26/2022  PCP: Rudene Anda, MD   Patient coming from: Home  I have personally briefly reviewed patient's old medical records in [place]  CC: SOB HPI: 42 year old Caucasian male history of hypertension, obesity, ADD who presents to the ER today with sudden onset of shortness of breath and near syncope.  Patient's been traveling since Sunday.  Has been flying and driving to the [place].  He states that he has been in the car for several hours at a time.  While traveling home today, he started experiencing sudden shortness of breath and near syncope while he was at the airport in [place].  He was supposed to fly to another city for another board meeting but came directly home.  He states that he felt suddenly short of breath at the airport.  He felt diaphoretic.  He states he nearly passed out due to the shortness of breath.  He recovered in the airport and boarded another flight to get home.  He denies any leg pain or leg swelling.  Maternal grandmother with a history of pulmonary embolism.  Patient denies any history of VTE.  On arrival to the ER, temp 90.3 heart rate 112 blood pressure 125/90 satting 98% on room air.  White count 14.6, hemoglobin 16.2, platelet 250  COVID-negative, influenza negative, RSV negative  Sodium 133, potassium 5.4, bicarb of 20, BUN of 18, creatinine 1.1  D-dimer less than 0.27  Lactic acid of 1.3  UA showed 80 ketones 30 protein  Chest x-ray showed hazy opacity left lung base which could represent infection.  [facility] hospitalist contacted for admission.     ED Course: WBC 14, CXR ?? Left basilar pneumonia  Review of Systems:  Review of Systems  Constitutional:  Negative for chills and fever.  HENT: Negative.    Eyes: Negative.   Respiratory:  Positive for shortness of breath.   Cardiovascular:  Negative.  Negative for leg swelling.       Near syncope Diaphoretic No chest pain  Gastrointestinal: Negative.   Genitourinary: Negative.   Musculoskeletal: Negative.   Skin: Negative.   Neurological: Negative.   Endo/Heme/Allergies: Negative.   Psychiatric/Behavioral: Negative.    All other systems reviewed and are negative.   Past Medical History:  Diagnosis Date   ADD (attention deficit disorder)    Blister of finger 08/29/2012   left ring   GERD (gastroesophageal reflux disease)    OTC prn   Sty 08/29/2012   of eye, will finish med. [id number]   Umbilical hernia [id number]    Past Surgical History:  Procedure Laterality Date   DENTAL SURGERY  as a child   X 2 - for tooth extraction   UMBILICAL HERNIA REPAIR  09/05/2012   Procedure: HERNIA REPAIR UMBILICAL ADULT;  Surgeon: Imogene Burn. Georgette Dover, MD;  Location: [facility];  Service: General;  Laterality: N/A;  umbilical hernia repair      reports that he quit smoking about 12 years ago. He has never used smokeless tobacco. He reports current alcohol use. He reports that he does not use drugs.  Allergies  Allergen Reactions   Shellfish Allergy    Sulfa Antibiotics Other (See Comments)    UNKNOWN REACTION - WAS AS A CHILD    Family History  Problem Relation Age of Onset   Hypertension Mother    Hypertension Father  Hyperlipidemia Father    Diabetes Father    Cancer - Colon Maternal Grandmother    Hypertension Maternal Grandmother    Pulmonary embolism Maternal Grandmother    Cancer - Colon Maternal Grandfather    Hypertension Maternal Grandfather    Hypertension Paternal Grandmother    Hypertension Paternal Grandfather    Multiple sclerosis Maternal Aunt     Prior to Admission medications   Medication Sig Start Date End Date Taking? Authorizing Provider  amphetamine-dextroamphetamine (ADDERALL XR) 30 MG 24 hr capsule Take one capsule daily.  May refill in two months. 07/30/15   Burchette, Alinda Sierras, MD   EPINEPHrine (EPIPEN 2-PAK) 0.3 mg/0.3 mL IJ SOAJ injection Inject 0.3 mLs (0.3 mg total) into the muscle once. 11/23/14   Shawna Orleans, Doe-Hyun R, DO  omeprazole (PRILOSEC) 20 MG capsule Take 20 mg by mouth daily as needed.     [provider]    Physical Exam: Vitals:   10/26/22 1400 10/26/22 1627 10/26/22 1700 10/26/22 1925  BP: (!) 148/101 (!) 149/88 (!) 145/89 (!) 143/78  Pulse: (!) 114 (!) 111 (!) 111 (!) 109  Resp:  (!) 21 (!) 21 20  Temp:  99 F (37.2 C) 99 F (37.2 C) 97.7 F (36.5 C)  TempSrc:  Oral  Oral  SpO2: 95% 98% 98% 99%  Weight:      Height:        Physical Exam Vitals and nursing note reviewed.  Constitutional:      General: He is not in acute distress.    Appearance: He is obese. He is not toxic-appearing or diaphoretic.  HENT:     Head: Normocephalic and atraumatic.     Nose: Nose normal.  Eyes:     General: No scleral icterus. Cardiovascular:     Rate and Rhythm: Regular rhythm. Tachycardia present.  Pulmonary:     Effort: Pulmonary effort is normal.     Breath sounds: Normal breath sounds.  Abdominal:     General: Abdomen is protuberant. Bowel sounds are normal. There is no distension.     Palpations: Abdomen is soft.     Tenderness: There is no abdominal tenderness.  Musculoskeletal:     Right lower leg: No edema.     Left lower leg: No edema.  Skin:    General: Skin is warm and dry.     Capillary Refill: Capillary refill takes less than 2 seconds.  Neurological:     General: No focal deficit present.     Mental Status: He is alert and oriented to person, place, and time.      Labs on Admission: I have personally reviewed following labs and imaging studies  CBC: Recent Labs  Lab 10/26/22 1123  WBC 14.6*  NEUTROABS 13.6*  HGB 16.2  HCT 48.6  MCV 92.9  PLT [id number]   Basic Metabolic Panel: Recent Labs  Lab 10/26/22 1123  NA 133*  K 5.4*  CL 100  CO2 22  GLUCOSE 137*  BUN 18  CREATININE 1.10  CALCIUM 9.0   GFR: Estimated  Creatinine Clearance: 124.1 mL/min (by C-G formula based on SCr of 1.1 mg/dL). Liver Function Tests: Recent Labs  Lab 10/26/22 1123  AST 28  ALT 45*  ALKPHOS 63  BILITOT 1.3*  PROT 8.0  ALBUMIN 4.6   No results for input(s): "LIPASE", "AMYLASE" in the last 168 hours. No results for input(s): "AMMONIA" in the last 168 hours. Coagulation Profile: Recent Labs  Lab 10/26/22 1318  INR 1.0  Cardiac Enzymes: No results for input(s): "CKTOTAL", "CKMB", "CKMBINDEX", "TROPONINI", "TROPONINIHS" in the last 168 hours. BNP (last 3 results) No results for input(s): "BNP" in the last 8760 hours. HbA1C: No results for input(s): "HGBA1C" in the last 72 hours. CBG: Recent Labs  Lab 10/26/22 1106  GLUCAP 123*   Lipid Profile: No results for input(s): "CHOL", "HDL", "LDLCALC", "TRIG", "CHOLHDL", "LDLDIRECT" in the last 72 hours. Thyroid Function Tests: No results for input(s): "TSH", "T4TOTAL", "FREET4", "T3FREE", "THYROIDAB" in the last 72 hours. Anemia Panel: No results for input(s): "VITAMINB12", "FOLATE", "FERRITIN", "TIBC", "IRON", "RETICCTPCT" in the last 72 hours. Urine analysis:    Component Value Date/Time   COLORURINE YELLOW 10/26/2022 1615   APPEARANCEUR CLEAR 10/26/2022 1615   LABSPEC 1.020 10/26/2022 1615   PHURINE 7.0 10/26/2022 1615   GLUCOSEU NEGATIVE 10/26/2022 1615   HGBUR NEGATIVE 10/26/2022 1615   BILIRUBINUR NEGATIVE 10/26/2022 1615   KETONESUR 80 (A) 10/26/2022 1615   PROTEINUR 30 (A) 10/26/2022 1615   NITRITE NEGATIVE 10/26/2022 1615   LEUKOCYTESUR NEGATIVE 10/26/2022 1615    Radiological Exams on Admission: I have personally reviewed images DG Chest 2 View  Result Date: 10/26/2022 CLINICAL DATA:  Fever EXAM: CHEST - 2 VIEW COMPARISON:  None Available. FINDINGS: No pleural effusion. No pneumothorax. There is a hazy opacity at the left lung base which could represent infection. There are also prominent bilateral interstitial opacities, which could represent  bronchovascular crowding in the setting of low lung volumes or atypical infection. No radiographically apparent displaced rib fractures. Vertebral body heights are maintained. Visualized upper abdomen is unremarkable IMPRESSION: Hazy opacity at the left lung base which could represent infection. Electronically Signed   By: Marin Roberts M.D.   On: 10/26/2022 11:43    EKG: My personal interpretation of EKG shows: sinus tachycardia    Assessment/Plan Principal Problem:   CAP (community acquired pneumonia) Active Problems:   Essential hypertension   ADD (attention deficit disorder)   Obesity (BMI 30-39.9)   Shellfish allergy   Hyperkalemia    Assessment and Plan: * CAP (community acquired pneumonia) Admit med/surg bed. Given his recent travel history and sudden onset of SOB, will need to r/o PE despite negative D-dimer.  Pt's history suggests high pre-test probability of PE. Pt with family history of PE(maternal grandmother). Pt with shellfish allergy. Will pretreat with solumedrol and benadryl. Continue with zithromax and rocephin. Check legionella and strep pneumo antigen.  Essential hypertension Pt on losartan at home. Given his hyperkalemia, will hold losartan for now. Use prn labetalol SBP >170 or DBP >100  Hyperkalemia Pt noted to have hyperkalemia on admission labs. Will hold losartan. Hydrate with IV NS. Repeat BMP in AM.  Shellfish allergy Will give IV solumedrol and IV benadryl prior to CTPA.  Obesity (BMI 30-39.9) Chronic. BMI 34.67.   ADD (attention deficit disorder) On adderalll at home. Hold during hospitalization.   DVT prophylaxis: SQ Heparin Code Status: Full Code Family Communication: discussed with pt and his mother patty at bedside  Disposition Plan: return home  Consults called: none  Admission status: Inpatient, Med-Surg   Kristopher Oppenheim, DO [facility] 10/26/2022, 8:03 PM

## 2022-10-26 NOTE — Assessment & Plan Note (Signed)
Will give IV solumedrol and IV benadryl prior to CTPA.

## 2022-10-26 NOTE — Sepsis Progress Note (Signed)
[facility] is followig code sepsis

## 2022-10-26 NOTE — Assessment & Plan Note (Signed)
Pt on losartan at home. Given his hyperkalemia, will hold losartan for now. Use prn labetalol SBP >170 or DBP >100

## 2022-10-26 NOTE — Assessment & Plan Note (Signed)
Chronic. BMI 34.67.

## 2022-10-26 NOTE — Assessment & Plan Note (Addendum)
Admit med/surg bed. Given his recent travel history and sudden onset of SOB, will need to r/o PE despite negative D-dimer.  Pt's history suggests high pre-test probability of PE. Pt with family history of PE(maternal grandmother). Pt with shellfish allergy. Will pretreat with solumedrol and benadryl. Continue with zithromax and rocephin. Check legionella and strep pneumo antigen.

## 2022-10-26 NOTE — ED Notes (Signed)
Seen before triage, HR 110, SPo2 98%, no resp distress, speaking in complete sentences.    10/26/22 1055  Respiratory Assessment  Assessment Type Assess only  Respiratory Pattern Regular;Unlabored;Symmetrical  Chest Assessment Chest expansion symmetrical  Bilateral Breath Sounds Clear  Oxygen Therapy/Pulse Ox  O2 Device Room Air  SpO2 98 %

## 2022-10-26 NOTE — ED Notes (Signed)
Report to floor

## 2022-10-26 NOTE — ED Notes (Signed)
Called [facility] for transport to [facility] '@5'$ :04.  Spoke to [place]

## 2022-10-26 NOTE — ED Provider Notes (Signed)
[facility] Provider Note   CSN: [id number] Arrival date & time: 10/26/22  1045     History  Chief Complaint  Patient presents with   Generalized Body Aches    Daniel Gardner is a 42 y.o. male with HTN, umbilical hernia, obesity, ADD who presents with body aches.   Reports diaphoresis, nausea/vomiting, generalized weakness came on suddenly while waiting at the airport; c/o vomiting x 3; back spasms, generalized aches. Denies any cough, nasal congestion, chest pain, shortness of breath, abdominal pain, urinary symptoms, diarrhea/constipation. Complains of severe fatigue and thirst as well. No h/o similar. Has reported generalized edema, bilateral legs symmetrically, due to recent travel and took a diurex yesterday, but that is the only unusual thing recently. No h/o blood clots.   HPI     Home Medications Prior to Admission medications   Medication Sig Start Date End Date Taking? Authorizing Provider  albuterol (VENTOLIN HFA) 108 (90 Base) MCG/ACT inhaler Inhale 2 puffs into the lungs every 6 (six) hours as needed for wheezing or shortness of breath. 05/13/22  Yes [provider]  amphetamine-dextroamphetamine (ADDERALL) 15 MG tablet Take 15 mg by mouth daily.   Yes [provider]  omeprazole (PRILOSEC) 20 MG capsule Take 20 mg by mouth daily as needed.    Yes [provider]  amLODipine (NORVASC) 10 MG tablet Take 1 tablet (10 mg total) by mouth daily. 10/29/22   Allie Bossier, MD  azithromycin [facility]) 250 MG tablet 1 tab p.o. daily 10/29/22   Allie Bossier, MD  cefdinir (OMNICEF) 300 MG capsule Take 1 capsule (300 mg total) by mouth every 12 (twelve) hours. 10/29/22   Allie Bossier, MD  dextromethorphan-guaiFENesin [facility]) 30-600 MG 12hr tablet Take 1 tablet by mouth 2 (two) times daily. 10/29/22   Allie Bossier, MD  EPINEPHrine (EPIPEN 2-PAK) 0.3 mg/0.3 mL IJ SOAJ injection Inject 0.3 mLs (0.3  mg total) into the muscle once. Patient not taking: Reported on 10/27/2022 11/23/14   Shawna Orleans, Doe-Hyun R, DO  melatonin 10 MG TABS Take 10 mg by mouth at bedtime as needed. 10/29/22   Allie Bossier, MD      Allergies    Shellfish allergy and Sulfa antibiotics    Review of Systems   Review of Systems Review of systems Positive for chills.  A 10 point review of systems was performed and is negative unless otherwise reported in HPI.  Physical Exam Updated Vital Signs BP (!) 144/89 (BP Location: Left Arm)   Pulse 73   Temp 97.8 F (36.6 C) (Oral)   Resp 18   Ht 6\' 2"  (1.88 m)   Wt 122.5 kg   SpO2 97%   BMI 34.67 kg/m  Physical Exam General: Uncomfortable appearing male, lying in bed.  HEENT:, Sclera anicteric, MMM, trachea midline.  Cardiology: Tachycardic rate/regular rhythm, no murmurs/rubs/gallops. BL radial and DP pulses equal bilaterally.  Resp: Normal respiratory effort. Mildly tachypneic. CTAB, no wheezes, rhonchi, crackles.  Abd: Soft, nontender, non-distended. No rebound tenderness or guarding.  GU: Deferred. MSK: No peripheral edema or signs of trauma. Extremities without deformity or TTP. No cyanosis or clubbing. Skin: warm, dry.  Neuro: A&Ox4, CNs II-XII grossly intact. MAEs. Sensation grossly intact.  Psych: Normal mood and affect.   ED Results / Procedures / Treatments   Labs (all labs ordered are listed, but only abnormal results are displayed) Labs Reviewed  COMPREHENSIVE METABOLIC PANEL - Abnormal; Notable for the  following components:      Result Value   Sodium 133 (*)    Potassium 5.4 (*)    Glucose, Bld 137 (*)    ALT 45 (*)    Total Bilirubin 1.3 (*)    All other components within normal limits  CBC WITH DIFFERENTIAL/PLATELET - Abnormal; Notable for the following components:   WBC 14.6 (*)    Neutro Abs 13.6 (*)    Lymphs Abs 0.5 (*)    All other components within normal limits  URINALYSIS, ROUTINE W REFLEX MICROSCOPIC - Abnormal; Notable for the  following components:   Ketones, ur 80 (*)    Protein, ur 30 (*)    All other components within normal limits  LACTIC ACID, PLASMA - Abnormal; Notable for the following components:   Lactic Acid, Venous 3.3 (*)    All other components within normal limits    EKG EKG Interpretation  Date/Time:  Thursday October 26 2022 11:11:08 EDT Ventricular Rate:  105 PR Interval:  144 QRS Duration: 90 QT Interval:  348 QTC Calculation: 460 R Axis:   96 Text Interpretation: Sinus tachycardia Borderline right axis deviation Confirmed by Cindee Lame [phone]) on 10/26/2022 11:42:07 AM  Radiology See ED course  Procedures .Critical Care  Performed by: Audley Hose, MD Authorized by: Audley Hose, MD   Critical care provider statement:    Critical care time (minutes):  35   Critical care was necessary to treat or prevent imminent or life-threatening deterioration of the following conditions:  Sepsis   Critical care was time spent personally by me on the following activities:  Development of treatment plan with patient or surrogate, discussions with consultants, evaluation of patient's response to treatment, examination of patient, ordering and review of laboratory studies, ordering and review of radiographic studies, ordering and performing treatments and interventions, pulse oximetry, re-evaluation of patient's condition, review of old charts and obtaining history from patient or surrogate   Care discussed with: admitting provider       Medications Ordered in ED Medications  0.9 %  sodium chloride infusion ( Intravenous New Bag/Given 10/26/22 1943)  cefTRIAXone (ROCEPHIN) 1 g in sodium chloride 0.9 % 100 mL IVPB (0 g Intravenous Stopped 10/26/22 1532)  ondansetron (ZOFRAN) injection 4 mg (4 mg Intravenous Given 10/26/22 1341)  lactated ringers bolus 1,000 mL (0 mLs Intravenous Stopped 10/26/22 1532)    And  lactated ringers bolus 1,000 mL (0 mLs Intravenous Stopped 10/26/22 1503)    And   lactated ringers bolus 1,000 mL (0 mLs Intravenous Stopped 10/26/22 1726)  acetaminophen (TYLENOL) tablet 1,000 mg (1,000 mg Oral Given 10/26/22 1932)  prochlorperazine (COMPAZINE) injection 10 mg (10 mg Intravenous Given 10/26/22 1931)  methylPREDNISolone sodium succinate (SOLU-MEDROL) 125 mg/2 mL injection 125 mg (125 mg Intravenous Given 10/26/22 1931)  diphenhydrAMINE (BENADRYL) injection 25 mg (25 mg Intravenous Given 10/26/22 1931)  sodium chloride 0.9 % bolus 500 mL (500 mLs Intravenous New Bag/Given 10/26/22 2101)  iohexol (OMNIPAQUE) 350 MG/ML injection 75 mL (75 mLs Intravenous Contrast Given 10/26/22 2031)    ED Course/ Medical Decision Making/ A&P                          Medical Decision Making Amount and/or Complexity of Data Reviewed Labs: ordered. Decision-making details documented in ED Course. Radiology: ordered. Decision-making details documented in ED Course.  Risk Prescription drug management. Decision regarding hospitalization.    This patient presents to the ED for  concern of acute onset chills/nausea/weakness; this involves an extensive number of treatment options, and is a complaint that carries with it a high risk of complications and morbidity.  I considered the following differential and admission for this acute, potentially life threatening condition.   MDM:    DDX for generalized weakness includes but is not limited to:  Infectious processes, severe metabolic derangements or electrolyte abnormalities, ischemia/ACS, heart failure, anemia, and intracranial/central processes but think these are unlikely given the history and physical exam. Patient is mildly tachypneic and tachycardic, with recent travel, will get d-dimer to r/o PE, no signs/ of DVT on exam.  With acute onset diaphoresis and chills, concern for infection. Patient is noted to be tachycardic to 110s, tachypneic, but not hypoxic. Reports no respiratory symptoms, urinary symptoms. Does have  nausea/vomiting but no abdominal pain, no abdominal TTP, no signs of acute abdomen on exam.  Clinical Course as of 11/11/22 1510  Thu Oct 26, 2022  1302 DG Chest 2 View FINDINGS: No pleural effusion. No pneumothorax. There is a hazy opacity at the left lung base which could represent infection. There are also prominent bilateral interstitial opacities, which could represent bronchovascular crowding in the setting of low lung volumes or atypical infection. No radiographically apparent displaced rib fractures. Vertebral body heights are maintained. Visualized upper abdomen is unremarkable  IMPRESSION: Hazy opacity at the left lung base which could represent infection.   [HN]  1302 WBC(!): 14.6 [HN]  1303 Pulse Rate(!): 112 C/f sepsis [HN]  1303 Potassium(!): 5.4 [HN]  1330 D-Dimer, Quant: <0.27 [HN]  [id number] Patient admitted to hospitalist for CAP w/ c/f sepsis, cultures drawn. [HN]    Clinical Course User Index [HN] Audley Hose, MD    Labs: I Ordered, and personally interpreted labs.  The pertinent results include:  those listed above  Imaging Studies ordered: I ordered imaging studies including CXR I independently visualized and interpreted imaging. I agree with the radiologist interpretation  Additional history obtained from wife at bedside.    Cardiac Monitoring: The patient was maintained on a cardiac monitor.  I personally viewed and interpreted the cardiac monitored which showed an underlying rhythm of: sinus tachycardia  Reevaluation: After the interventions noted above, I reevaluated the patient and found that they have :stayed the same  Social Determinants of Health: Patient lives independently, traveling for work  Disposition:  Admit  Co morbidities that complicate the patient evaluation  Past Medical History:  Diagnosis Date   ADD (attention deficit disorder)    Blister of finger 08/29/2012   left ring   GERD (gastroesophageal reflux disease)    OTC  prn   Sty 08/29/2012   of eye, will finish med. [id number]   Umbilical hernia [id number]     Medicines Meds ordered this encounter  Medications   DISCONTD: lactated ringers bolus 1,000 mL   cefTRIAXone (ROCEPHIN) 1 g in sodium chloride 0.9 % 100 mL IVPB    Order Specific Question:   Antibiotic Indication:    Answer:   CAP   DISCONTD: azithromycin (ZITHROMAX) 500 mg in sodium chloride 0.9 % 250 mL IVPB    Order Specific Question:   Antibiotic Indication:    Answer:   CAP   ondansetron (ZOFRAN) injection 4 mg   AND Linked Order Group    lactated ringers bolus 1,000 mL     Order Specific Question:   Total Body Weight basis for 30 mL/kg  bolus delivery     Answer:   122.5 kg  lactated ringers bolus 1,000 mL     Order Specific Question:   Total Body Weight basis for 30 mL/kg  bolus delivery     Answer:   122.5 kg    lactated ringers bolus 1,000 mL     Order Specific Question:   Total Body Weight basis for 30 mL/kg  bolus delivery     Answer:   122.5 kg   0.9 %  sodium chloride infusion   acetaminophen (TYLENOL) tablet 1,000 mg   prochlorperazine (COMPAZINE) injection 10 mg   methylPREDNISolone sodium succinate (SOLU-MEDROL) 125 mg/2 mL injection 125 mg    IV methylprednisolone will be converted to either a q12h or q24h frequency with the same total daily dose (TDD).  Ordered Dose: 1 to 125 mg TDD; convert to: TDD q24h.  Ordered Dose: 126 to 250 mg TDD; convert to: TDD div q12h.  Ordered Dose: >250 mg TDD; DAW.   diphenhydrAMINE (BENADRYL) injection 25 mg   DISCONTD: heparin injection 5,000 Units   DISCONTD: cefTRIAXone (ROCEPHIN) 2 g in sodium chloride 0.9 % 100 mL IVPB    Order Specific Question:   Antibiotic Indication:    Answer:   CAP   DISCONTD: azithromycin (ZITHROMAX) tablet 500 mg   DISCONTD: acetaminophen (TYLENOL) tablet 1,000 mg   DISCONTD: prochlorperazine (COMPAZINE) injection 10 mg   DISCONTD: melatonin tablet 10 mg   DISCONTD: labetalol (NORMODYNE) injection 10 mg    DISCONTD: amLODipine (NORVASC) tablet 10 mg   sodium chloride 0.9 % bolus 500 mL   iohexol (OMNIPAQUE) 350 MG/ML injection 75 mL   DISCONTD: 0.9 %  sodium chloride infusion    Carrier Fluid Protocol   DISCONTD: 0.9 %  sodium chloride infusion   DISCONTD: ipratropium-albuterol (DUONEB) 0.5-2.5 (3) MG/3ML nebulizer solution 3 mL   DISCONTD: dextromethorphan-guaiFENesin (MUCINEX DM) 30-600 MG per 12 hr tablet 1 tablet   DISCONTD: methylPREDNISolone sodium succinate (SOLU-MEDROL) 125 mg/2 mL injection 60 mg    IV methylprednisolone will be converted to either a q12h or q24h frequency with the same total daily dose (TDD).  Ordered Dose: 1 to 125 mg TDD; convert to: TDD q24h.  Ordered Dose: 126 to 250 mg TDD; convert to: TDD div q12h.  Ordered Dose: >250 mg TDD; DAW.   DISCONTD: ipratropium-albuterol (DUONEB) 0.5-2.5 (3) MG/3ML nebulizer solution 3 mL   DISCONTD: amphetamine-dextroamphetamine (ADDERALL) tablet 15 mg   DISCONTD: cefdinir (OMNICEF) capsule 300 mg   DISCONTD: cefdinir (OMNICEF) capsule 300 mg   azithromycin (ZITHROMAX) 250 MG tablet    Sig: 1 tab p.o. daily    Dispense:  4 each    Refill:  0   cefdinir (OMNICEF) 300 MG capsule    Sig: Take 1 capsule (300 mg total) by mouth every 12 (twelve) hours.    Dispense:  6 capsule    Refill:  0   amLODipine (NORVASC) 10 MG tablet    Sig: Take 1 tablet (10 mg total) by mouth daily.    Dispense:  30 tablet    Refill:  0   melatonin 10 MG TABS    Sig: Take 10 mg by mouth at bedtime as needed.    Dispense:  30 tablet    Refill:  0   dextromethorphan-guaiFENesin (MUCINEX DM) 30-600 MG 12hr tablet    Sig: Take 1 tablet by mouth 2 (two) times daily.    Dispense:  10 tablet    Refill:  0    I have reviewed the patients home medicines and have made adjustments  as needed  Problem List / ED Course: Problem List Items Addressed This Visit       Other   Hyperkalemia    Pt noted to have hyperkalemia on admission labs. Will hold  losartan. Hydrate with IV NS. Repeat BMP in AM.      Other Visit Diagnoses     Pneumonia of left lower lobe due to infectious organism    -  Primary   Relevant Medications   cefTRIAXone (ROCEPHIN) 1 g in sodium chloride 0.9 % 100 mL IVPB (Completed)   diphenhydrAMINE (BENADRYL) injection 25 mg (Completed)   albuterol (VENTOLIN HFA) 108 (90 Base) MCG/ACT inhaler   azithromycin (ZITHROMAX) 250 MG tablet   cefdinir (OMNICEF) 300 MG capsule   dextromethorphan-guaiFENesin (MUCINEX DM) 30-600 MG 12hr tablet                   This note was created using dictation software, which may contain spelling or grammatical errors.    Audley Hose, MD 11/11/22 [phone]

## 2022-10-26 NOTE — Assessment & Plan Note (Signed)
Pt noted to have hyperkalemia on admission labs. Will hold losartan. Hydrate with IV NS. Repeat BMP in AM.

## 2022-10-26 NOTE — Subjective & Objective (Addendum)
CC: SOB HPI: 42 year old Caucasian male history of hypertension, obesity, ADD who presents to the ER today with sudden onset of shortness of breath and near syncope.  Patient's been traveling since Sunday.  Has been flying and driving to the [place].  He states that he has been in the car for several hours at a time.  While traveling home today, he started experiencing sudden shortness of breath and near syncope while he was at the airport in [place].  He was supposed to fly to another city for another board meeting but came directly home.  He states that he felt suddenly short of breath at the airport.  He felt diaphoretic.  He states he nearly passed out due to the shortness of breath.  He recovered in the airport and boarded another flight to get home.  He denies any leg pain or leg swelling.  Maternal grandmother with a history of pulmonary embolism.  Patient denies any history of VTE.  On arrival to the ER, temp 90.3 heart rate 112 blood pressure 125/90 satting 98% on room air.  White count 14.6, hemoglobin 16.2, platelet 250  COVID-negative, influenza negative, RSV negative  Sodium 133, potassium 5.4, bicarb of 20, BUN of 18, creatinine 1.1  D-dimer less than 0.27  Lactic acid of 1.3  UA showed 80 ketones 30 protein  Chest x-ray showed hazy opacity left lung base which could represent infection.  [facility] hospitalist contacted for admission.

## 2022-10-26 NOTE — Plan of Care (Signed)
Plan of Care Note for accepted transfer  Patient: Daniel Gardner    [id number]  DOA: 10/26/2022     Facility requesting transfer: [facility] Requesting Provider: Dr. Mayra Neer Reason for transfer: CAP, suspected Sepsis Facility course: Showing signs of sepsis. PNA on CXR. Given abx and stabilized on room air.  Plan of care: The patient is accepted for admission to [facility]  unit, at [facility].    Author: [place], MD  10/26/2022  Check [email or web address] for on-call coverage.  Nursing staff, Please call [place] number on Amion as soon as patient's arrival, so appropriate admitting provider can evaluate the pt.

## 2022-10-27 DIAGNOSIS — A419 Sepsis, unspecified organism: Secondary | ICD-10-CM

## 2022-10-27 LAB — CBC WITH DIFFERENTIAL/PLATELET
Abs Immature Granulocytes: 0.02 10*3/uL (ref 0.00–0.07)
Basophils Absolute: 0 10*3/uL (ref 0.0–0.1)
Basophils Relative: 0 %
Eosinophils Absolute: 0 10*3/uL (ref 0.0–0.5)
Eosinophils Relative: 0 %
HCT: 43.4 % (ref 39.0–52.0)
Hemoglobin: 14.6 g/dL (ref 13.0–17.0)
Immature Granulocytes: 0 %
Lymphocytes Relative: 8 %
Lymphs Abs: 0.5 10*3/uL — ABNORMAL LOW (ref 0.7–4.0)
MCH: 32.3 pg (ref 26.0–34.0)
MCHC: 33.6 g/dL (ref 30.0–36.0)
MCV: 96 fL (ref 80.0–100.0)
Monocytes Absolute: 0.1 10*3/uL (ref 0.1–1.0)
Monocytes Relative: 1 %
Neutro Abs: 5.9 10*3/uL (ref 1.7–7.7)
Neutrophils Relative %: 91 %
Platelets: 201 10*3/uL (ref 150–400)
RBC: 4.52 MIL/uL (ref 4.22–5.81)
RDW: 12.9 % (ref 11.5–15.5)
WBC: 6.4 10*3/uL (ref 4.0–10.5)
nRBC: 0 % (ref 0.0–0.2)

## 2022-10-27 LAB — COMPREHENSIVE METABOLIC PANEL
ALT: 34 U/L (ref 0–44)
AST: 24 U/L (ref 15–41)
Albumin: 3.7 g/dL (ref 3.5–5.0)
Alkaline Phosphatase: 49 U/L (ref 38–126)
Anion gap: 8 (ref 5–15)
BUN: 14 mg/dL (ref 6–20)
CO2: 24 mmol/L (ref 22–32)
Calcium: 8.1 mg/dL — ABNORMAL LOW (ref 8.9–10.3)
Chloride: 104 mmol/L (ref 98–111)
Creatinine, Ser: 0.84 mg/dL (ref 0.61–1.24)
GFR, Estimated: >60 mL/min (ref >60)
Glucose, Bld: 184 mg/dL — ABNORMAL HIGH (ref 70–99)
Potassium: 3.8 mmol/L (ref 3.5–5.1)
Sodium: 136 mmol/L (ref 135–145)
Total Bilirubin: 1 mg/dL (ref 0.3–1.2)
Total Protein: 6.5 g/dL (ref 6.5–8.1)

## 2022-10-27 LAB — MAGNESIUM: Magnesium: 2.2 mg/dL (ref 1.7–2.4)

## 2022-10-27 LAB — RESPIRATORY PANEL BY PCR

## 2022-10-27 LAB — PHOSPHORUS: Phosphorus: 2.8 mg/dL (ref 2.5–4.6)

## 2022-10-27 LAB — PROCALCITONIN: Procalcitonin: 0.23 ng/mL

## 2022-10-27 LAB — STREP PNEUMONIAE URINARY ANTIGEN: Strep Pneumo Urinary Antigen: NEGATIVE

## 2022-10-27 LAB — HIV ANTIBODY (ROUTINE TESTING W REFLEX): HIV Screen 4th Generation wRfx: NONREACTIVE

## 2022-10-27 MED ORDER — DM-GUAIFENESIN ER 30-600 MG PO TB12
1.0000 | ORAL_TABLET | Freq: Two times a day (BID) | ORAL | Status: DC
  Administered 2022-10-27 – 2022-10-29 (×4): 1 via ORAL
  Filled 2022-10-27 (×4): qty 1

## 2022-10-27 MED ORDER — IPRATROPIUM-ALBUTEROL 0.5-2.5 (3) MG/3ML IN SOLN
3.0000 mL | Freq: Four times a day (QID) | RESPIRATORY_TRACT | Status: DC
  Administered 2022-10-28: 3 mL via RESPIRATORY_TRACT
  Filled 2022-10-27: qty 3

## 2022-10-27 MED ORDER — SODIUM CHLORIDE 0.9 % IV SOLN: INTRAVENOUS | Status: DC

## 2022-10-27 MED ORDER — METHYLPREDNISOLONE SODIUM SUCC 125 MG IJ SOLR
60.0000 mg | INTRAMUSCULAR | Status: DC
  Administered 2022-10-27 – 2022-10-28 (×2): 60 mg via INTRAVENOUS
  Filled 2022-10-27 (×2): qty 2

## 2022-10-27 MED ORDER — SODIUM CHLORIDE 0.9 % IV SOLN: INTRAVENOUS | Status: DC | PRN

## 2022-10-27 NOTE — Progress Notes (Signed)
Patient's family wondering when MD will be making rounds. Made Dr. Dia Crawford, MD aware of patient/family's request. Informed family that MD is working his way around.

## 2022-10-27 NOTE — Plan of Care (Signed)
  Problem: Education: Goal: Knowledge of General Education information will improve Description Including pain rating scale, medication(s)/side effects and non-pharmacologic comfort measures Outcome: Progressing   

## 2022-10-27 NOTE — Progress Notes (Signed)
PROGRESS NOTE    Daniel Gardner  [id number] DOB: Jul 07, 1981 DOA: 10/26/2022 PCP: Rudene Anda, MD     Brief Narrative:  42 year old WM PMHx hypertension, obesity, ADD   Presents to the ER today with sudden onset of shortness of breath and near syncope.  Patient's been traveling since Sunday.  Has been flying and driving to the [place].  He states that he has been in the car for several hours at a time.  While traveling home today, he started experiencing sudden shortness of breath and near syncope while he was at the airport in [place].  He was supposed to fly to another city for another board meeting but came directly home.   He states that he felt suddenly short of breath at the airport.  He felt diaphoretic.  He states he nearly passed out due to the shortness of breath.  He recovered in the airport and boarded another flight to get home.  He denies any leg pain or leg swelling.  Maternal grandmother with a history of pulmonary embolism.  Patient denies any history of VTE.   On arrival to the ER, temp 90.3 heart rate 112 blood pressure 125/90 satting 98% on room air.   White count 14.6, hemoglobin 16.2, platelet 250   COVID-negative, influenza negative, RSV negative   Sodium 133, potassium 5.4, bicarb of 20, BUN of 18, creatinine 1.1   D-dimer less than 0.27   Lactic acid of 1.3   UA showed 80 ketones 30 protein   Chest x-ray showed hazy opacity left lung base which could represent infection.      Subjective: A/O x 4, negative CP, negative SOB.  Positive dry cough   Assessment & Plan: Covid vaccination;   Principal Problem:   CAP (community acquired pneumonia) Active Problems:   Essential hypertension   ADD (attention deficit disorder)   Obesity (BMI 30-39.9)   Shellfish allergy   Hyperkalemia   Sepsis pneumonia - On admission patient met criteria for severe sepsis HR> 90, RR> 20, source of infection lungs, -Patient seen by another [facility] physician on  3/15 no charge  CAP (community acquired pneumonia) Admit med/surg bed. Given his recent travel history and sudden onset of SOB, will need to r/o PE despite negative D-dimer.  Pt's history suggests high pre-test probability of PE. Pt with family history of PE(maternal grandmother). Pt with shellfish allergy. Will pretreat with solumedrol and benadryl. Continue with zithromax and rocephin. Check legionella and strep pneumo antigen.  PE? - 3/14 CTA PE negative for PE.     Essential hypertension Pt on losartan at home. Given his hyperkalemia, will hold losartan for now. Use prn labetalol SBP >170 or DBP >100   Hyperkalemia Pt noted to have hyperkalemia on admission labs. Will hold losartan. Hydrate with IV NS. Repeat BMP in AM.   Shellfish allergy Will give IV solumedrol and IV benadryl prior to CTPA.   Obesity (BMI 30-39.9) Chronic. BMI 34.67.    ADD (attention deficit disorder) On adderalll at home. Hold during hospitalization.    Mobility Assessment (last 72 hours)     Mobility Assessment     Row Name 10/26/22 2100           Does patient have an order for bedrest or is patient medically unstable No - Continue assessment       What is the highest level of mobility based on the progressive mobility assessment? Level 5 (Walks with assist in room/hall) - Balance while stepping forward/back and can  walk in room with assist - Complete                      DVT prophylaxis:  Code Status: Full Family Communication: 3/15 mother and father at bedside for discussion plan of care all questions answered Status is: Inpatient    Dispo: The patient is from: Home              Anticipated d/c is to: Home              Anticipated d/c date is: 2 days              Patient currently is not medically stable to d/c.      Consultants:    Procedures/Significant Events:    I have personally reviewed and interpreted all radiology studies and my findings are as  above.  VENTILATOR SETTINGS:    Cultures   Antimicrobials: Anti-infectives (From admission, onward)    Start     Dose/Rate Route Frequency Ordered Stop   10/27/22 1000  cefTRIAXone (ROCEPHIN) 2 g in sodium chloride 0.9 % 100 mL IVPB        2 g 200 mL/hr over 30 Minutes Intravenous Every 24 hours 10/26/22 2118 10/31/22 0959   10/27/22 1000  azithromycin (ZITHROMAX) tablet 500 mg        500 mg Oral Daily 10/26/22 2118 10/31/22 0959   10/26/22 1315  cefTRIAXone (ROCEPHIN) 1 g in sodium chloride 0.9 % 100 mL IVPB        1 g 200 mL/hr over 30 Minutes Intravenous  Once 10/26/22 1305 10/26/22 1532   10/26/22 1315  azithromycin (ZITHROMAX) 500 mg in sodium chloride 0.9 % 250 mL IVPB  Status:  Discontinued        500 mg 250 mL/hr over 60 Minutes Intravenous Every 24 hours 10/26/22 1305 10/26/22 2118         Devices    LINES / TUBES:      Continuous Infusions:  sodium chloride 100 mL/hr at 10/26/22 1943   cefTRIAXone (ROCEPHIN)  IV       Objective: Vitals:   10/26/22 1925 10/26/22 2251 10/27/22 0319 10/27/22 0649  BP: (!) 143/78 131/72 115/75 137/82  Pulse: (!) 109 89 83 80  Resp: 20 16 18 16   Temp: 97.7 F (36.5 C) 97.6 F (36.4 C) 97.7 F (36.5 C) (!) 97.4 F (36.3 C)  TempSrc: Oral Oral Oral   SpO2: 99% 93% 93% 94%  Weight:      Height:        Intake/Output Summary (Last 24 hours) at 10/27/2022 0752 Last data filed at 10/27/2022 0600 Gross per 24 hour  Intake 2010.24 ml  Output --  Net 2010.24 ml   Filed Weights   10/26/22 1102  Weight: 122.5 kg    Examination:  Patient seen by another [facility] physician on 3/15 no charge .     Data Reviewed: Care during the described time interval was provided by me .  I have reviewed this patient's available data, including medical history, events of note, physical examination, and all test results as part of my evaluation.  CBC: Recent Labs  Lab 10/26/22 1123  WBC 14.6*  NEUTROABS 13.6*  HGB 16.2  HCT 48.6   MCV 92.9  PLT [id number]   Basic Metabolic Panel: Recent Labs  Lab 10/26/22 1123  NA 133*  K 5.4*  CL 100  CO2 22  GLUCOSE 137*  BUN 18  CREATININE 1.10  CALCIUM 9.0   GFR: Estimated Creatinine Clearance: 124.1 mL/min (by C-G formula based on SCr of 1.1 mg/dL). Liver Function Tests: Recent Labs  Lab 10/26/22 1123  AST 28  ALT 45*  ALKPHOS 63  BILITOT 1.3*  PROT 8.0  ALBUMIN 4.6   No results for input(s): "LIPASE", "AMYLASE" in the last 168 hours. No results for input(s): "AMMONIA" in the last 168 hours. Coagulation Profile: Recent Labs  Lab 10/26/22 1318  INR 1.0   Cardiac Enzymes: No results for input(s): "CKTOTAL", "CKMB", "CKMBINDEX", "TROPONINI" in the last 168 hours. BNP (last 3 results) No results for input(s): "PROBNP" in the last 8760 hours. HbA1C: No results for input(s): "HGBA1C" in the last 72 hours. CBG: Recent Labs  Lab 10/26/22 1106  GLUCAP 123*   Lipid Profile: No results for input(s): "CHOL", "HDL", "LDLCALC", "TRIG", "CHOLHDL", "LDLDIRECT" in the last 72 hours. Thyroid Function Tests: No results for input(s): "TSH", "T4TOTAL", "FREET4", "T3FREE", "THYROIDAB" in the last 72 hours. Anemia Panel: No results for input(s): "VITAMINB12", "FOLATE", "FERRITIN", "TIBC", "IRON", "RETICCTPCT" in the last 72 hours. Sepsis Labs: Recent Labs  Lab 10/26/22 1306 10/26/22 1914 10/26/22 2133 10/27/22 0426  PROCALCITON  --   --   --  0.23  LATICACIDVEN 1.3 3.3* 1.4  --     Recent Results (from the past 240 hour(s))  Resp panel by RT-PCR (RSV, Flu A&B, Covid) Anterior Nasal Swab     Status: None   Collection Time: 10/26/22 11:04 AM   Specimen: Anterior Nasal Swab  Result Value Ref Range Status   SARS Coronavirus 2 by RT PCR NEGATIVE NEGATIVE Final    Comment: (NOTE) SARS-CoV-2 target nucleic acids are NOT DETECTED.  The SARS-CoV-2 RNA is generally detectable in upper respiratory specimens during the acute phase of infection. The  lowest concentration of SARS-CoV-2 viral copies this assay can detect is 138 copies/mL. A negative result does not preclude SARS-Cov-2 infection and should not be used as the sole basis for treatment or other patient management decisions. A negative result may occur with  improper specimen collection/handling, submission of specimen other than nasopharyngeal swab, presence of viral mutation(s) within the areas targeted by this assay, and inadequate number of viral copies(<138 copies/mL). A negative result must be combined with clinical observations, patient history, and epidemiological information. The expected result is Negative.  Fact Sheet for Patients:  EntrepreneurPulse.com.au  Fact Sheet for Healthcare Providers:  IncredibleEmployment.be  This test is no t yet approved or cleared by the [place] FDA and  has been authorized for detection and/or diagnosis of SARS-CoV-2 by FDA under an Emergency Use Authorization (EUA). This EUA will remain  in effect (meaning this test can be used) for the duration of the COVID-19 declaration under Section 564(b)(1) of the Act, 21 U.S.C.section 360bbb-3(b)(1), unless the authorization is terminated  or revoked sooner.       Influenza A by PCR NEGATIVE NEGATIVE Final   Influenza B by PCR NEGATIVE NEGATIVE Final    Comment: (NOTE) The Xpert Xpress SARS-CoV-2/FLU/RSV plus assay is intended as an aid in the diagnosis of influenza from Nasopharyngeal swab specimens and should not be used as a sole basis for treatment. Nasal washings and aspirates are unacceptable for Xpert Xpress SARS-CoV-2/FLU/RSV testing.  Fact Sheet for Patients: EntrepreneurPulse.com.au  Fact Sheet for Healthcare Providers: IncredibleEmployment.be  This test is not yet approved or cleared by the [place] FDA and has been authorized for detection and/or diagnosis of SARS-CoV-2 by FDA under  an Emergency Use Authorization (  EUA). This EUA will remain in effect (meaning this test can be used) for the duration of the COVID-19 declaration under Section 564(b)(1) of the Act, 21 U.S.C. section 360bbb-3(b)(1), unless the authorization is terminated or revoked.     Resp Syncytial Virus by PCR NEGATIVE NEGATIVE Final    Comment: (NOTE) Fact Sheet for Patients: EntrepreneurPulse.com.au  Fact Sheet for Healthcare Providers: IncredibleEmployment.be  This test is not yet approved or cleared by the [place] FDA and has been authorized for detection and/or diagnosis of SARS-CoV-2 by FDA under an Emergency Use Authorization (EUA). This EUA will remain in effect (meaning this test can be used) for the duration of the COVID-19 declaration under Section 564(b)(1) of the Act, 21 U.S.C. section 360bbb-3(b)(1), unless the authorization is terminated or revoked.  Performed at [facility], [address] [place], [place] [address]          Radiology Studies: CT Angio Chest Pulmonary Embolism (PE) W or WO Contrast  Result Date: 10/26/2022 CLINICAL DATA:  High probability for PE EXAM: CT ANGIOGRAPHY CHEST WITH CONTRAST TECHNIQUE: Multidetector CT imaging of the chest was performed using the standard protocol during bolus administration of intravenous contrast. Multiplanar CT image reconstructions and MIPs were obtained to evaluate the vascular anatomy. RADIATION DOSE REDUCTION: This exam was performed according to the departmental dose-optimization program which includes automated exposure control, adjustment of the mA and/or kV according to patient size and/or use of iterative reconstruction technique. CONTRAST:  34mL OMNIPAQUE IOHEXOL 350 MG/ML SOLN COMPARISON:  None Available. FINDINGS: Cardiovascular: Satisfactory opacification of the pulmonary arteries to the segmental level. No evidence of pulmonary embolism. Normal heart size. No  pericardial effusion. Mediastinum/Nodes: No enlarged mediastinal, hilar, or axillary lymph nodes. Thyroid gland, trachea, and esophagus demonstrate no significant findings. Lungs/Pleura: There is atelectasis in the lung bases. The lungs are otherwise clear. There is no pleural effusion or pneumothorax. Upper Abdomen: There is fatty infiltration of the liver. Musculoskeletal: No chest wall abnormality. No acute or significant osseous findings. Review of the MIP images confirms the above findings. IMPRESSION: 1. No evidence for pulmonary embolism. 2. Bibasilar atelectasis. 3. Fatty infiltration of the liver. Electronically Signed   By: Ronney Asters M.D.   On: 10/26/2022 21:10   DG Chest 2 View  Result Date: 10/26/2022 CLINICAL DATA:  Fever EXAM: CHEST - 2 VIEW COMPARISON:  None Available. FINDINGS: No pleural effusion. No pneumothorax. There is a hazy opacity at the left lung base which could represent infection. There are also prominent bilateral interstitial opacities, which could represent bronchovascular crowding in the setting of low lung volumes or atypical infection. No radiographically apparent displaced rib fractures. Vertebral body heights are maintained. Visualized upper abdomen is unremarkable IMPRESSION: Hazy opacity at the left lung base which could represent infection. Electronically Signed   By: Marin Roberts M.D.   On: 10/26/2022 11:43        Scheduled Meds:  amLODipine  10 mg Oral Daily   azithromycin  500 mg Oral Daily   heparin  5,000 Units Subcutaneous Q8H   Continuous Infusions:  sodium chloride 100 mL/hr at 10/26/22 1943   cefTRIAXone (ROCEPHIN)  IV       LOS: 1 day    Time spent:40 min    Meryem Haertel, Geraldo Docker, MD [facility]   If 7PM-7AM, please contact night-coverage 10/27/2022, 7:52 AM

## 2022-10-27 NOTE — TOC Progression Note (Addendum)
Transition of Care [facility]) - Progression Note    Patient Details  Name: Daniel Gardner MRN: [id number] Date of Birth: 22-Jul-1981  Transition [facility]) CM/SW Contact  Servando Snare, [place] Phone Number: 10/27/2022, 8:41 AM  Clinical Narrative:      Transition of Care [facility]) Screening Note   Patient Details  Name: Daniel Gardner Date of Birth: [birth year]   Transition of Care [facility]) CM/SW Contact:    Servando Snare, LCSW Phone Number: 10/27/2022, 8:42 AM    Transition of Care Department [facility]) has reviewed patient and no TOC needs have been identified at this time. We will continue to monitor patient advancement through interdisciplinary progression rounds. If new patient transition needs arise, please place a TOC consult.         Expected Discharge Plan and Services                                               Social Determinants of Health (SDOH) Interventions SDOH Screenings   Food Insecurity: No Food Insecurity (10/26/2022)  Housing: Low Risk  (10/26/2022)  Transportation Needs: No Transportation Needs (10/26/2022)  Utilities: Not At Risk (10/26/2022)  Tobacco Use: Medium Risk (10/26/2022)    Readmission Risk Interventions     No data to display

## 2022-10-27 NOTE — Progress Notes (Signed)
Jen (Lab) called to report Critical Lab: Lactic Acid 3.3. On-Call NP Zebedee Iba) made aware and orders given.

## 2022-10-28 DIAGNOSIS — F909 Attention-deficit hyperactivity disorder, unspecified type: Secondary | ICD-10-CM | POA: Diagnosis not present

## 2022-10-28 DIAGNOSIS — J189 Pneumonia, unspecified organism: Secondary | ICD-10-CM | POA: Diagnosis not present

## 2022-10-28 DIAGNOSIS — I10 Essential (primary) hypertension: Secondary | ICD-10-CM | POA: Diagnosis not present

## 2022-10-28 DIAGNOSIS — E875 Hyperkalemia: Secondary | ICD-10-CM | POA: Diagnosis not present

## 2022-10-28 LAB — EXPECTORATED SPUTUM ASSESSMENT W GRAM STAIN, RFLX TO RESP C

## 2022-10-28 LAB — URINE CULTURE: Culture: NO GROWTH

## 2022-10-28 LAB — CBC WITH DIFFERENTIAL/PLATELET
Abs Immature Granulocytes: 0.06 10*3/uL (ref 0.00–0.07)
Basophils Absolute: 0 10*3/uL (ref 0.0–0.1)
Basophils Relative: 0 %
Eosinophils Absolute: 0 10*3/uL (ref 0.0–0.5)
Eosinophils Relative: 0 %
HCT: 41.3 % (ref 39.0–52.0)
Hemoglobin: 13.6 g/dL (ref 13.0–17.0)
Immature Granulocytes: 1 %
Lymphocytes Relative: 16 %
Lymphs Abs: 1.4 10*3/uL (ref 0.7–4.0)
MCH: 31.6 pg (ref 26.0–34.0)
MCHC: 32.9 g/dL (ref 30.0–36.0)
MCV: 95.8 fL (ref 80.0–100.0)
Monocytes Absolute: 0.2 10*3/uL (ref 0.1–1.0)
Monocytes Relative: 3 %
Neutro Abs: 6.9 10*3/uL (ref 1.7–7.7)
Neutrophils Relative %: 80 %
Platelets: 218 10*3/uL (ref 150–400)
RBC: 4.31 MIL/uL (ref 4.22–5.81)
RDW: 12.7 % (ref 11.5–15.5)
WBC: 8.5 10*3/uL (ref 4.0–10.5)
nRBC: 0 % (ref 0.0–0.2)

## 2022-10-28 LAB — COMPREHENSIVE METABOLIC PANEL
ALT: 31 U/L (ref 0–44)
AST: 24 U/L (ref 15–41)
Albumin: 3.6 g/dL (ref 3.5–5.0)
Alkaline Phosphatase: 40 U/L (ref 38–126)
Anion gap: 8 (ref 5–15)
BUN: 16 mg/dL (ref 6–20)
CO2: 23 mmol/L (ref 22–32)
Calcium: 8 mg/dL — ABNORMAL LOW (ref 8.9–10.3)
Chloride: 106 mmol/L (ref 98–111)
Creatinine, Ser: 0.89 mg/dL (ref 0.61–1.24)
GFR, Estimated: >60 mL/min (ref >60)
Glucose, Bld: 157 mg/dL — ABNORMAL HIGH (ref 70–99)
Potassium: 3.9 mmol/L (ref 3.5–5.1)
Sodium: 137 mmol/L (ref 135–145)
Total Bilirubin: 0.6 mg/dL (ref 0.3–1.2)
Total Protein: 6.1 g/dL — ABNORMAL LOW (ref 6.5–8.1)

## 2022-10-28 LAB — MAGNESIUM: Magnesium: 2.5 mg/dL — ABNORMAL HIGH (ref 1.7–2.4)

## 2022-10-28 LAB — PHOSPHORUS: Phosphorus: 2.6 mg/dL (ref 2.5–4.6)

## 2022-10-28 MED ORDER — AMPHETAMINE-DEXTROAMPHETAMINE 10 MG PO TABS
15.0000 mg | ORAL_TABLET | Freq: Every day | ORAL | Status: DC
  Administered 2022-10-29: 15 mg via ORAL
  Filled 2022-10-28: qty 2

## 2022-10-28 MED ORDER — IPRATROPIUM-ALBUTEROL 0.5-2.5 (3) MG/3ML IN SOLN: 3.0000 mL | RESPIRATORY_TRACT | Status: DC | PRN

## 2022-10-28 NOTE — Plan of Care (Signed)

## 2022-10-28 NOTE — Progress Notes (Signed)
SATURATION QUALIFICATIONS:   Patient Saturations on Room Air at Rest = 98%  Patient Saturations on Room Air while Ambulating = 96%  Patient Saturations on 0 Liters of oxygen while Ambulating = 96%  Please briefly explain why patient needs home oxygen:  Patient walked a full lap around the unit with no complaints of shortness of breath. The lowest number the 02 sats dropped to while on RA when ambulating was 96%. Patient does not need oxygen for home.   Anda Kraft, RN

## 2022-10-28 NOTE — Progress Notes (Signed)
PROGRESS NOTE    Daniel Gardner  [id number] DOB: Apr 01, 1981 DOA: 10/26/2022 PCP: Rudene Anda, MD     Brief Narrative:  42 year old WM PMHx hypertension, obesity, ADD   Presents to the ER today with sudden onset of shortness of breath and near syncope.  Patient's been traveling since Sunday.  Has been flying and driving to the [place].  He states that he has been in the car for several hours at a time.  While traveling home today, he started experiencing sudden shortness of breath and near syncope while he was at the airport in [place].  He was supposed to fly to another city for another board meeting but came directly home.   He states that he felt suddenly short of breath at the airport.  He felt diaphoretic.  He states he nearly passed out due to the shortness of breath.  He recovered in the airport and boarded another flight to get home.  He denies any leg pain or leg swelling.  Maternal grandmother with a history of pulmonary embolism.  Patient denies any history of VTE.   On arrival to the ER, temp 90.3 heart rate 112 blood pressure 125/90 satting 98% on room air.   White count 14.6, hemoglobin 16.2, platelet 250   COVID-negative, influenza negative, RSV negative   Sodium 133, potassium 5.4, bicarb of 20, BUN of 18, creatinine 1.1   D-dimer less than 0.27   Lactic acid of 1.3   UA showed 80 ketones 30 protein   Chest x-ray showed hazy opacity left lung base which could represent infection.      Subjective: 3/16 afebrile overnight A/O x 4, negative CP, negative SOB.  Sitting in bed eating lunch.   Assessment & Plan: Covid vaccination;   Principal Problem:   Sepsis due to pneumonia [facility]) Active Problems:   Essential hypertension   ADD (attention deficit disorder)   Obesity (BMI 30-39.9)   Shellfish allergy   Hyperkalemia   Sepsis pneumonia - On admission patient met criteria for severe sepsis HR> 90, RR> 20, source of infection lungs, -Patient  seen by another [facility] on 3/15 no charge - 3/16 resolving.  CAP (community acquired pneumonia) -CTA negative PE -SATURATION QUALIFICATIONS:    Patient Saturations on Room Air at Rest = 98%   Patient Saturations on [employer] while Ambulating = 96%   Patient Saturations on 0 Liters of oxygen while Ambulating = 96%   Please briefly explain why patient needs home oxygen: -Does not qualify for home O2.  PE? - 3/14 CTA PE negative for PE.     Essential HTN -Amlodipine 10 mg daily -Labetalol PRN    Hyperkalemia -Resolved.     Shellfish allergy -Will give IV solumedrol and IV benadryl prior to CTPA.   Obesity (BMI 30-39.9) -Discussed with PCP     ADD (attention deficit disorder) -Adderall 15 mg daily     Mobility Assessment (last 72 hours)     Mobility Assessment     Row Name 10/28/22 0953 10/27/22 0855 10/26/22 2100       Does patient have an order for bedrest or is patient medically unstable No - Continue assessment No - Continue assessment No - Continue assessment     What is the highest level of mobility based on the progressive mobility assessment? Level 5 (Walks with assist in room/hall) - Balance while stepping forward/back and can walk in room with assist - Complete Level 5 (Walks with assist in room/hall) - Balance while  stepping forward/back and can walk in room with assist - Complete Level 5 (Walks with assist in room/hall) - Balance while stepping forward/back and can walk in room with assist - Complete                    DVT prophylaxis:  Code Status: Full Family Communication: 3/15 mother and father at bedside for discussion plan of care all questions answered Status is: Inpatient    Dispo: The patient is from: Home              Anticipated d/c is to: Home              Anticipated d/c date is: 2 days              Patient currently is not medically stable to d/c.      Consultants:    Procedures/Significant Events:    I have  personally reviewed and interpreted all radiology studies and my findings are as above.  VENTILATOR SETTINGS:    Cultures 3/14 blood RIGHT AC NGTD 3/14 blood LEFT AC NGTD 3/15 respiratory virus panel negative 3/15 sputum not acceptable for testing recollect     Antimicrobials: Anti-infectives (From admission, onward)    Start     Dose/Rate Route Frequency Ordered Stop   10/27/22 1000  cefTRIAXone (ROCEPHIN) 2 g in sodium chloride 0.9 % 100 mL IVPB        2 g 200 mL/hr over 30 Minutes Intravenous Every 24 hours 10/26/22 2118 10/31/22 0959   10/27/22 1000  azithromycin (ZITHROMAX) tablet 500 mg        500 mg Oral Daily 10/26/22 2118 10/31/22 0959   10/26/22 1315  cefTRIAXone (ROCEPHIN) 1 g in sodium chloride 0.9 % 100 mL IVPB        1 g 200 mL/hr over 30 Minutes Intravenous  Once 10/26/22 1305 10/26/22 1532   10/26/22 1315  azithromycin (ZITHROMAX) 500 mg in sodium chloride 0.9 % 250 mL IVPB  Status:  Discontinued        500 mg 250 mL/hr over 60 Minutes Intravenous Every 24 hours 10/26/22 1305 10/26/22 2118         Devices    LINES / TUBES:      Continuous Infusions:  sodium chloride Stopped (10/27/22 1220)   sodium chloride 100 mL/hr at 10/28/22 0939   cefTRIAXone (ROCEPHIN)  IV 2 g (10/28/22 0942)     Objective: Vitals:   10/27/22 1256 10/27/22 2046 10/28/22 0559 10/28/22 0915  BP: 129/82 126/79 128/82   Pulse: 84 84 76   Resp: 18 18 18    Temp: 97.7 F (36.5 C) 97.9 F (36.6 C) 97.7 F (36.5 C)   TempSrc: Oral Oral    SpO2: 97% 100% 96% 95%  Weight:      Height:        Intake/Output Summary (Last 24 hours) at 10/28/2022 1308 Last data filed at 10/28/2022 1000 Gross per 24 hour  Intake 3715.06 ml  Output --  Net 3715.06 ml    Filed Weights   10/26/22 1102  Weight: 122.5 kg    Examination:  Patient seen by another [facility] physician on 3/15 no charge .     Data Reviewed: Care during the described time interval was provided by me .  I have  reviewed this patient's available data, including medical history, events of note, physical examination, and all test results as part of my evaluation.  CBC: Recent Labs  Lab 10/26/22  1123 10/27/22 0426 10/28/22 0332  WBC 14.6* 6.4 8.5  NEUTROABS 13.6* 5.9 6.9  HGB 16.2 14.6 13.6  HCT 48.6 43.4 41.3  MCV 92.9 96.0 95.8  PLT 250 201 [id number]    Basic Metabolic Panel: Recent Labs  Lab 10/26/22 1123 10/27/22 0426 10/28/22 0332  NA 133* 136 137  K 5.4* 3.8 3.9  CL 100 104 106  CO2 22 24 23   GLUCOSE 137* 184* 157*  BUN 18 14 16   CREATININE 1.10 0.84 0.89  CALCIUM 9.0 8.1* 8.0*  MG  --  2.2 2.5*  PHOS  --  2.8 2.6    GFR: Estimated Creatinine Clearance: 153.4 mL/min (by C-G formula based on SCr of 0.89 mg/dL). Liver Function Tests: Recent Labs  Lab 10/26/22 1123 10/27/22 0426 10/28/22 0332  AST 28 24 24   ALT 45* 34 31  ALKPHOS 63 49 40  BILITOT 1.3* 1.0 0.6  PROT 8.0 6.5 6.1*  ALBUMIN 4.6 3.7 3.6    No results for input(s): "LIPASE", "AMYLASE" in the last 168 hours. No results for input(s): "AMMONIA" in the last 168 hours. Coagulation Profile: Recent Labs  Lab 10/26/22 1318  INR 1.0    Cardiac Enzymes: No results for input(s): "CKTOTAL", "CKMB", "CKMBINDEX", "TROPONINI" in the last 168 hours. BNP (last 3 results) No results for input(s): "PROBNP" in the last 8760 hours. HbA1C: No results for input(s): "HGBA1C" in the last 72 hours. CBG: Recent Labs  Lab 10/26/22 1106  GLUCAP 123*    Lipid Profile: No results for input(s): "CHOL", "HDL", "LDLCALC", "TRIG", "CHOLHDL", "LDLDIRECT" in the last 72 hours. Thyroid Function Tests: No results for input(s): "TSH", "T4TOTAL", "FREET4", "T3FREE", "THYROIDAB" in the last 72 hours. Anemia Panel: No results for input(s): "VITAMINB12", "FOLATE", "FERRITIN", "TIBC", "IRON", "RETICCTPCT" in the last 72 hours. Sepsis Labs: Recent Labs  Lab 10/26/22 1306 10/26/22 1914 10/26/22 2133 10/27/22 0426  PROCALCITON  --    --   --  0.23  LATICACIDVEN 1.3 3.3* 1.4  --      Recent Results (from the past 240 hour(s))  Resp panel by RT-PCR (RSV, Flu A&B, Covid) Anterior Nasal Swab     Status: None   Collection Time: 10/26/22 11:04 AM   Specimen: Anterior Nasal Swab  Result Value Ref Range Status   SARS Coronavirus 2 by RT PCR NEGATIVE NEGATIVE Final    Comment: (NOTE) SARS-CoV-2 target nucleic acids are NOT DETECTED.  The SARS-CoV-2 RNA is generally detectable in upper respiratory specimens during the acute phase of infection. The lowest concentration of SARS-CoV-2 viral copies this assay can detect is 138 copies/mL. A negative result does not preclude SARS-Cov-2 infection and should not be used as the sole basis for treatment or other patient management decisions. A negative result may occur with  improper specimen collection/handling, submission of specimen other than nasopharyngeal swab, presence of viral mutation(s) within the areas targeted by this assay, and inadequate number of viral copies(<138 copies/mL). A negative result must be combined with clinical observations, patient history, and epidemiological information. The expected result is Negative.  Fact Sheet for Patients:  EntrepreneurPulse.com.au  Fact Sheet for Healthcare Providers:  IncredibleEmployment.be  This test is no t yet approved or cleared by the [place] FDA and  has been authorized for detection and/or diagnosis of SARS-CoV-2 by FDA under an Emergency Use Authorization (EUA). This EUA will remain  in effect (meaning this test can be used) for the duration of the COVID-19 declaration under Section 564(b)(1) of the Act, 21 U.S.C.section  360bbb-3(b)(1), unless the authorization is terminated  or revoked sooner.       Influenza A by PCR NEGATIVE NEGATIVE Final   Influenza B by PCR NEGATIVE NEGATIVE Final    Comment: (NOTE) The Xpert Xpress SARS-CoV-2/FLU/RSV plus assay is intended  as an aid in the diagnosis of influenza from Nasopharyngeal swab specimens and should not be used as a sole basis for treatment. Nasal washings and aspirates are unacceptable for Xpert Xpress SARS-CoV-2/FLU/RSV testing.  Fact Sheet for Patients: EntrepreneurPulse.com.au  Fact Sheet for Healthcare Providers: IncredibleEmployment.be  This test is not yet approved or cleared by the [place] FDA and has been authorized for detection and/or diagnosis of SARS-CoV-2 by FDA under an Emergency Use Authorization (EUA). This EUA will remain in effect (meaning this test can be used) for the duration of the COVID-19 declaration under Section 564(b)(1) of the Act, 21 U.S.C. section 360bbb-3(b)(1), unless the authorization is terminated or revoked.     Resp Syncytial Virus by PCR NEGATIVE NEGATIVE Final    Comment: (NOTE) Fact Sheet for Patients: EntrepreneurPulse.com.au  Fact Sheet for Healthcare Providers: IncredibleEmployment.be  This test is not yet approved or cleared by the [place] FDA and has been authorized for detection and/or diagnosis of SARS-CoV-2 by FDA under an Emergency Use Authorization (EUA). This EUA will remain in effect (meaning this test can be used) for the duration of the COVID-19 declaration under Section 564(b)(1) of the Act, 21 U.S.C. section 360bbb-3(b)(1), unless the authorization is terminated or revoked.  Performed at [facility], [address] [place], [place] [address]   Blood culture (routine x 2)     Status: None (Preliminary result)   Collection Time: 10/26/22  1:26 PM   Specimen: BLOOD  Result Value Ref Range Status   Specimen Description   Final    BLOOD RIGHT ANTECUBITAL Performed at [facility], [address] [place], [place] [address]    Special Requests   Final    BOTTLES DRAWN AEROBIC AND ANAEROBIC Blood Culture results may not be  optimal due to an inadequate volume of blood received in culture bottles Performed at [facility], [address] [place], [place] [address]    Culture   Final    NO GROWTH 2 DAYS Performed at [facility], [place] [address], [place], [place] [address]    Report Status PENDING  Incomplete  Blood culture (routine x 2)     Status: None (Preliminary result)   Collection Time: 10/26/22  1:47 PM   Specimen: BLOOD  Result Value Ref Range Status   Specimen Description   Final    BLOOD LEFT ANTECUBITAL Performed at [facility], [address] [place], [place] [address]    Special Requests   Final    BOTTLES DRAWN AEROBIC AND ANAEROBIC Blood Culture results may not be optimal due to an inadequate volume of blood received in culture bottles Performed at [facility], [address] [place], [place] [address]    Culture   Final    NO GROWTH 2 DAYS Performed at [facility], [place] [address], [place], [place] [address]    Report Status PENDING  Incomplete  Urine Culture (for pregnant, neutropenic or urologic patients or patients with an indwelling urinary catheter)     Status: None   Collection Time: 10/26/22  4:15 PM   Specimen: Urine, Clean Catch  Result Value Ref Range  Status   Specimen Description   Final    URINE, CLEAN CATCH Performed at [facility], [address] [place], [place] [address]    Special Requests   Final    NONE Performed at [facility], [address] [place], [place] [address]    Culture   Final    NO GROWTH Performed at [facility], [place] [address] [place], [place] [address]    Report Status 10/28/2022 FINAL  Final  Respiratory (~20 pathogens) panel by PCR     Status: None   Collection Time: 10/27/22  6:21 AM   Specimen: Nasopharyngeal Swab; Respiratory  Result Value Ref Range Status   Adenovirus NOT DETECTED NOT DETECTED Final   Coronavirus 229E NOT DETECTED NOT DETECTED Final     Comment: (NOTE) The Coronavirus on the Respiratory Panel, DOES NOT test for the novel  Coronavirus (2019 nCoV)    Coronavirus HKU1 NOT DETECTED NOT DETECTED Final   Coronavirus NL63 NOT DETECTED NOT DETECTED Final   Coronavirus OC43 NOT DETECTED NOT DETECTED Final   Metapneumovirus NOT DETECTED NOT DETECTED Final   Rhinovirus / Enterovirus NOT DETECTED NOT DETECTED Final   Influenza A NOT DETECTED NOT DETECTED Final   Influenza B NOT DETECTED NOT DETECTED Final   Parainfluenza Virus 1 NOT DETECTED NOT DETECTED Final   Parainfluenza Virus 2 NOT DETECTED NOT DETECTED Final   Parainfluenza Virus 3 NOT DETECTED NOT DETECTED Final   Parainfluenza Virus 4 NOT DETECTED NOT DETECTED Final   Respiratory Syncytial Virus NOT DETECTED NOT DETECTED Final   Bordetella pertussis NOT DETECTED NOT DETECTED Final   Bordetella Parapertussis NOT DETECTED NOT DETECTED Final   Chlamydophila pneumoniae NOT DETECTED NOT DETECTED Final   Mycoplasma pneumoniae NOT DETECTED NOT DETECTED Final    Comment: Performed at [facility], [place]. [address], [place], [place] [address]  Expectorated Sputum Assessment w Gram Stain, Rflx to Resp Cult     Status: None   Collection Time: 10/27/22 10:00 AM   Specimen: Expectorated Sputum  Result Value Ref Range Status   Specimen Description EXPECTORATED SPUTUM  Final   Special Requests NONE  Final   Sputum evaluation   Final    Sputum specimen not acceptable for testing.  Please recollect.   NOTIFIED STEPHANIE RN FOR RECOLLECT 10/28/22 AT 1211 BY MG Performed at [facility], [place] [address], [place], [place] [address]    Report Status 10/28/2022 FINAL  Final         Radiology Studies: CT Angio Chest Pulmonary Embolism (PE) W or WO Contrast  Result Date: 10/26/2022 CLINICAL DATA:  High probability for PE EXAM: CT ANGIOGRAPHY CHEST WITH CONTRAST TECHNIQUE: Multidetector CT imaging of the chest was performed using the standard protocol during bolus  administration of intravenous contrast. Multiplanar CT image reconstructions and MIPs were obtained to evaluate the vascular anatomy. RADIATION DOSE REDUCTION: This exam was performed according to the departmental dose-optimization program which includes automated exposure control, adjustment of the mA and/or kV according to patient size and/or use of iterative reconstruction technique. CONTRAST:  29mL OMNIPAQUE IOHEXOL 350 MG/ML SOLN COMPARISON:  None Available. FINDINGS: Cardiovascular: Satisfactory opacification of the pulmonary arteries to the segmental level. No evidence of pulmonary embolism. Normal heart size. No pericardial effusion. Mediastinum/Nodes: No enlarged mediastinal, hilar, or axillary lymph nodes. Thyroid gland, trachea, and esophagus demonstrate no significant findings. Lungs/Pleura: There is atelectasis in the lung bases. The lungs are otherwise clear.  There is no pleural effusion or pneumothorax. Upper Abdomen: There is fatty infiltration of the liver. Musculoskeletal: No chest wall abnormality. No acute or significant osseous findings. Review of the MIP images confirms the above findings. IMPRESSION: 1. No evidence for pulmonary embolism. 2. Bibasilar atelectasis. 3. Fatty infiltration of the liver. Electronically Signed   By: Ronney Asters M.D.   On: 10/26/2022 21:10        Scheduled Meds:  amLODipine  10 mg Oral Daily   azithromycin  500 mg Oral Daily   dextromethorphan-guaiFENesin  1 tablet Oral BID   heparin  5,000 Units Subcutaneous Q8H   methylPREDNISolone (SOLU-MEDROL) injection  60 mg Intravenous Q24H   Continuous Infusions:  sodium chloride Stopped (10/27/22 1220)   sodium chloride 100 mL/hr at 10/28/22 0939   cefTRIAXone (ROCEPHIN)  IV 2 g (10/28/22 0942)     LOS: 2 days    Time spent:40 min    Nasiya Pascual, Geraldo Docker, MD [facility]   If 7PM-7AM, please contact night-coverage 10/28/2022, 1:08 PM

## 2022-10-29 DIAGNOSIS — E875 Hyperkalemia: Secondary | ICD-10-CM | POA: Diagnosis not present

## 2022-10-29 DIAGNOSIS — J189 Pneumonia, unspecified organism: Secondary | ICD-10-CM | POA: Diagnosis not present

## 2022-10-29 DIAGNOSIS — I10 Essential (primary) hypertension: Secondary | ICD-10-CM | POA: Diagnosis not present

## 2022-10-29 DIAGNOSIS — F909 Attention-deficit hyperactivity disorder, unspecified type: Secondary | ICD-10-CM | POA: Diagnosis not present

## 2022-10-29 LAB — CBC WITH DIFFERENTIAL/PLATELET
Abs Immature Granulocytes: 0.06 10*3/uL (ref 0.00–0.07)
Basophils Absolute: 0 10*3/uL (ref 0.0–0.1)
Basophils Relative: 0 %
Eosinophils Absolute: 0 10*3/uL (ref 0.0–0.5)
Eosinophils Relative: 0 %
HCT: 42.2 % (ref 39.0–52.0)
Hemoglobin: 14.2 g/dL (ref 13.0–17.0)
Immature Granulocytes: 1 %
Lymphocytes Relative: 24 %
Lymphs Abs: 1.6 10*3/uL (ref 0.7–4.0)
MCH: 31.8 pg (ref 26.0–34.0)
MCHC: 33.6 g/dL (ref 30.0–36.0)
MCV: 94.6 fL (ref 80.0–100.0)
Monocytes Absolute: 0.1 10*3/uL (ref 0.1–1.0)
Monocytes Relative: 2 %
Neutro Abs: 4.9 10*3/uL (ref 1.7–7.7)
Neutrophils Relative %: 73 %
Platelets: 166 10*3/uL (ref 150–400)
RBC: 4.46 MIL/uL (ref 4.22–5.81)
RDW: 12.7 % (ref 11.5–15.5)
WBC: 6.6 10*3/uL (ref 4.0–10.5)
nRBC: 0 % (ref 0.0–0.2)

## 2022-10-29 LAB — COMPREHENSIVE METABOLIC PANEL
ALT: 32 U/L (ref 0–44)
AST: 23 U/L (ref 15–41)
Albumin: 3.7 g/dL (ref 3.5–5.0)
Alkaline Phosphatase: 37 U/L — ABNORMAL LOW (ref 38–126)
Anion gap: 9 (ref 5–15)
BUN: 15 mg/dL (ref 6–20)
CO2: 22 mmol/L (ref 22–32)
Calcium: 8.2 mg/dL — ABNORMAL LOW (ref 8.9–10.3)
Chloride: 106 mmol/L (ref 98–111)
Creatinine, Ser: 0.88 mg/dL (ref 0.61–1.24)
GFR, Estimated: >60 mL/min (ref >60)
Glucose, Bld: 162 mg/dL — ABNORMAL HIGH (ref 70–99)
Potassium: 4.2 mmol/L (ref 3.5–5.1)
Sodium: 137 mmol/L (ref 135–145)
Total Bilirubin: 0.6 mg/dL (ref 0.3–1.2)
Total Protein: 6.2 g/dL — ABNORMAL LOW (ref 6.5–8.1)

## 2022-10-29 LAB — PHOSPHORUS: Phosphorus: 4 mg/dL (ref 2.5–4.6)

## 2022-10-29 LAB — MAGNESIUM: Magnesium: 2.6 mg/dL — ABNORMAL HIGH (ref 1.7–2.4)

## 2022-10-29 MED ORDER — CEFDINIR 300 MG PO CAPS: 300.0000 mg | ORAL_CAPSULE | Freq: Two times a day (BID) | ORAL | 0 refills | Status: AC

## 2022-10-29 MED ORDER — MELATONIN 10 MG PO TABS: 10.0000 mg | ORAL_TABLET | Freq: Every evening | ORAL | 0 refills | Status: AC | PRN

## 2022-10-29 MED ORDER — DM-GUAIFENESIN ER 30-600 MG PO TB12: 1.0000 | ORAL_TABLET | Freq: Two times a day (BID) | ORAL | 0 refills | Status: AC

## 2022-10-29 MED ORDER — CEFDINIR 300 MG PO CAPS: 300.0000 mg | ORAL_CAPSULE | Freq: Two times a day (BID) | ORAL | Status: DC

## 2022-10-29 MED ORDER — AZITHROMYCIN 250 MG PO TABS: ORAL_TABLET | ORAL | 0 refills | Status: AC

## 2022-10-29 MED ORDER — AMLODIPINE BESYLATE 10 MG PO TABS: 10.0000 mg | ORAL_TABLET | Freq: Every day | ORAL | 0 refills | Status: AC

## 2022-10-29 MED ORDER — CEFDINIR 300 MG PO CAPS
300.0000 mg | ORAL_CAPSULE | Freq: Two times a day (BID) | ORAL | Status: DC
  Administered 2022-10-29: 300 mg via ORAL
  Filled 2022-10-29: qty 1

## 2022-10-29 NOTE — Plan of Care (Signed)
  Problem: Education: Goal: Knowledge of General Education information will improve Description: Including pain rating scale, medication(s)/side effects and non-pharmacologic comfort measures Outcome: Progressing   Problem: Health Behavior/Discharge Planning: Goal: Ability to manage health-related needs will improve Outcome: Progressing   Problem: Activity: Goal: Risk for activity intolerance will decrease Outcome: Progressing   

## 2022-10-29 NOTE — Progress Notes (Signed)
The patient is alert and oriented and has been seen by his physician. The orders for discharge were written. IV has been removed. Went over discharge instructions with patient and family. He is being walked down to his ride for discharge with all of his belongings.

## 2022-10-29 NOTE — Discharge Summary (Addendum)
Physician Discharge Summary  Daniel Gardner [id number] DOB: January 31, 1981 DOA: 10/26/2022  PCP: Rudene Anda, MD  Admit date: 10/26/2022 Discharge date: 10/29/2022  Time spent: 35 minutes  Recommendations for Outpatient Follow-up:   Sepsis pneumonia - On admission patient met criteria for severe sepsis HR> 90, RR> 20, source of infection lungs, -Patient seen by another [facility] on 3/15 no charge - 3/16 resolving. -Follow-up in 1 to 2 weeks with PCP sepsis pneumonia.   CAP (community acquired pneumonia) -CTA negative PE -Complete 5-day course antibiotics -SATURATION QUALIFICATIONS:    Patient Saturations on Room Air at Rest = 98%   Patient Saturations on Room Air while Ambulating = 96%   Patient Saturations on 0 Liters of oxygen while Ambulating = 96%   Please briefly explain why patient needs home oxygen: -Does not qualify for home O2.   PE? - 3/14 CTA PE negative for PE.     Essential HTN -Amlodipine 10 mg daily -Labetalol PRN -PCP to decide when/if to restart losartan.     Hyperkalemia -Losartan held secondary to hyperkalemia -Resolved.     Shellfish allergy -Will give IV solumedrol and IV benadryl prior to CTPA.   Obesity (BMI 30-39.9) -Discussed with PCP     ADD (attention deficit disorder) -Adderall 15 mg daily   Hyperglycemia - Secondary to steroid use.  Will not discharge on steroids, should return to normal.  No history of diabetes.   Discharge Diagnoses:  Principal Problem:   Sepsis due to pneumonia [facility]) Active Problems:   Essential hypertension   ADD (attention deficit disorder)   Obesity (BMI 30-39.9)   Shellfish allergy   Hyperkalemia   Discharge Condition: Stable  Diet recommendation: Heart healthy/carb modified  Filed Weights   10/26/22 1102  Weight: 122.5 kg    History of present illness:  42 year old WM PMHx hypertension, obesity, ADD    Presents to the ER today with sudden onset of shortness of breath and near  syncope.  Patient's been traveling since Sunday.  Has been flying and driving to the [place].  He states that he has been in the car for several hours at a time.  While traveling home today, he started experiencing sudden shortness of breath and near syncope while he was at the airport in [place].  He was supposed to fly to another city for another board meeting but came directly home.   He states that he felt suddenly short of breath at the airport.  He felt diaphoretic.  He states he nearly passed out due to the shortness of breath.  He recovered in the airport and boarded another flight to get home.  He denies any leg pain or leg swelling.  Maternal grandmother with a history of pulmonary embolism.  Patient denies any history of VTE.   On arrival to the ER, temp 90.3 heart rate 112 blood pressure 125/90 satting 98% on room air.   White count 14.6, hemoglobin 16.2, platelet 250   COVID-negative, influenza negative, RSV negative   Sodium 133, potassium 5.4, bicarb of 20, BUN of 18, creatinine 1.1   D-dimer less than 0.27   Lactic acid of 1.3   UA showed 80 ketones 30 protein   Chest x-ray showed hazy opacity left lung base which could represent infection.  Hospital Course:  See above   Cultures  3/14 influenza A/B negative 3/14 RSV negative 3/14 SARS coronavirus negative 3/14 blood RIGHT AC NGTD 3/14 blood LEFT AC NGTD 3/15 respiratory virus panel negative  Antibiotics Anti-infectives (  From admission, onward)    Start     Ordered Stop   10/29/22 0945  cefdinir (OMNICEF) capsule 300 mg  Status:  Discontinued        10/29/22 0856 10/29/22 0858   10/29/22 0945  cefdinir (OMNICEF) capsule 300 mg        10/29/22 0858 11/01/22 2359   10/27/22 1000  cefTRIAXone (ROCEPHIN) 2 g in sodium chloride 0.9 % 100 mL IVPB  Status:  Discontinued        10/26/22 2118 10/29/22 0856   10/27/22 1000  azithromycin (ZITHROMAX) tablet 500 mg        10/26/22 2118 11/01/22 0959   10/26/22  1315  cefTRIAXone (ROCEPHIN) 1 g in sodium chloride 0.9 % 100 mL IVPB        10/26/22 1305 10/26/22 1532   10/26/22 1315  azithromycin (ZITHROMAX) 500 mg in sodium chloride 0.9 % 250 mL IVPB  Status:  Discontinued        10/26/22 1305 10/26/22 2118         Discharge Exam: Vitals:   10/28/22 0559 10/28/22 0915 10/28/22 1337 10/29/22 0550  BP: 128/82  127/73 114/77  Pulse: 76  74 67  Resp: 18  18 18   Temp: 97.7 F (36.5 C)  97.8 F (36.6 C) 97.8 F (36.6 C)  TempSrc:    Oral  SpO2: 96% 95% 96% 97%  Weight:      Height:       Physical Exam:  General: No acute respiratory distress Eyes: negative scleral hemorrhage, negative anisocoria, negative icterus ENT: Negative Runny nose, negative gingival bleeding, Neck:  Negative scars, masses, torticollis, lymphadenopathy, JVD Lungs: Clear to auscultation bilaterally without wheezes or crackles Cardiovascular: Regular rate and rhythm without murmur gallop or rub normal S1 and S2     Discharge Instructions   Allergies as of 10/29/2022       Reactions   Shellfish Allergy    Sulfa Antibiotics Other (See Comments)   UNKNOWN REACTION - WAS AS A CHILD        Medication List     STOP taking these medications    losartan 100 MG tablet Commonly known as: COZAAR       TAKE these medications    albuterol 108 (90 Base) MCG/ACT inhaler Commonly known as: VENTOLIN HFA Inhale 2 puffs into the lungs every 6 (six) hours as needed for wheezing or shortness of breath.   amLODipine 10 MG tablet Commonly known as: NORVASC Take 1 tablet (10 mg total) by mouth daily.   amphetamine-dextroamphetamine 15 MG tablet Commonly known as: ADDERALL Take 15 mg by mouth daily. What changed: Another medication with the same name was removed. Continue taking this medication, and follow the directions you see here.   azithromycin 250 MG tablet Commonly known as: ZITHROMAX 1 tab p.o. daily   cefdinir 300 MG capsule Commonly known as:  OMNICEF Take 1 capsule (300 mg total) by mouth every 12 (twelve) hours.   dextromethorphan-guaiFENesin 30-600 MG 12hr tablet Commonly known as: MUCINEX DM Take 1 tablet by mouth 2 (two) times daily.   EPINEPHrine 0.3 mg/0.3 mL Soaj injection Commonly known as: EpiPen 2-Pak Inject 0.3 mLs (0.3 mg total) into the muscle once.   Melatonin 10 MG Tabs Take 10 mg by mouth at bedtime as needed.   omeprazole 20 MG capsule Commonly known as: PRILOSEC Take 20 mg by mouth daily as needed.       Allergies  Allergen Reactions   Shellfish Allergy  Sulfa Antibiotics Other (See Comments)    UNKNOWN REACTION - WAS AS A CHILD      The results of significant diagnostics from this hospitalization (including imaging, microbiology, ancillary and laboratory) are listed below for reference.    Significant Diagnostic Studies: CT Angio Chest Pulmonary Embolism (PE) W or WO Contrast  Result Date: 10/26/2022 CLINICAL DATA:  High probability for PE EXAM: CT ANGIOGRAPHY CHEST WITH CONTRAST TECHNIQUE: Multidetector CT imaging of the chest was performed using the standard protocol during bolus administration of intravenous contrast. Multiplanar CT image reconstructions and MIPs were obtained to evaluate the vascular anatomy. RADIATION DOSE REDUCTION: This exam was performed according to the departmental dose-optimization program which includes automated exposure control, adjustment of the mA and/or kV according to patient size and/or use of iterative reconstruction technique. CONTRAST:  68mL OMNIPAQUE IOHEXOL 350 MG/ML SOLN COMPARISON:  None Available. FINDINGS: Cardiovascular: Satisfactory opacification of the pulmonary arteries to the segmental level. No evidence of pulmonary embolism. Normal heart size. No pericardial effusion. Mediastinum/Nodes: No enlarged mediastinal, hilar, or axillary lymph nodes. Thyroid gland, trachea, and esophagus demonstrate no significant findings. Lungs/Pleura: There is  atelectasis in the lung bases. The lungs are otherwise clear. There is no pleural effusion or pneumothorax. Upper Abdomen: There is fatty infiltration of the liver. Musculoskeletal: No chest wall abnormality. No acute or significant osseous findings. Review of the MIP images confirms the above findings. IMPRESSION: 1. No evidence for pulmonary embolism. 2. Bibasilar atelectasis. 3. Fatty infiltration of the liver. Electronically Signed   By: Ronney Asters M.D.   On: 10/26/2022 21:10   DG Chest 2 View  Result Date: 10/26/2022 CLINICAL DATA:  Fever EXAM: CHEST - 2 VIEW COMPARISON:  None Available. FINDINGS: No pleural effusion. No pneumothorax. There is a hazy opacity at the left lung base which could represent infection. There are also prominent bilateral interstitial opacities, which could represent bronchovascular crowding in the setting of low lung volumes or atypical infection. No radiographically apparent displaced rib fractures. Vertebral body heights are maintained. Visualized upper abdomen is unremarkable IMPRESSION: Hazy opacity at the left lung base which could represent infection. Electronically Signed   By: Marin Roberts M.D.   On: 10/26/2022 11:43    Microbiology: Recent Results (from the past 240 hour(s))  Resp panel by RT-PCR (RSV, Flu A&B, Covid) Anterior Nasal Swab     Status: None   Collection Time: 10/26/22 11:04 AM   Specimen: Anterior Nasal Swab  Result Value Ref Range Status   SARS Coronavirus 2 by RT PCR NEGATIVE NEGATIVE Final    Comment: (NOTE) SARS-CoV-2 target nucleic acids are NOT DETECTED.  The SARS-CoV-2 RNA is generally detectable in upper respiratory specimens during the acute phase of infection. The lowest concentration of SARS-CoV-2 viral copies this assay can detect is 138 copies/mL. A negative result does not preclude SARS-Cov-2 infection and should not be used as the sole basis for treatment or other patient management decisions. A negative result may occur  with  improper specimen collection/handling, submission of specimen other than nasopharyngeal swab, presence of viral mutation(s) within the areas targeted by this assay, and inadequate number of viral copies(<138 copies/mL). A negative result must be combined with clinical observations, patient history, and epidemiological information. The expected result is Negative.  Fact Sheet for Patients:  EntrepreneurPulse.com.au  Fact Sheet for Healthcare Providers:  IncredibleEmployment.be  This test is no t yet approved or cleared by the [place] FDA and  has been authorized for detection and/or diagnosis of SARS-CoV-2  by FDA under an Emergency Use Authorization (EUA). This EUA will remain  in effect (meaning this test can be used) for the duration of the COVID-19 declaration under Section 564(b)(1) of the Act, 21 U.S.C.section 360bbb-3(b)(1), unless the authorization is terminated  or revoked sooner.       Influenza A by PCR NEGATIVE NEGATIVE Final   Influenza B by PCR NEGATIVE NEGATIVE Final    Comment: (NOTE) The Xpert Xpress SARS-CoV-2/FLU/RSV plus assay is intended as an aid in the diagnosis of influenza from Nasopharyngeal swab specimens and should not be used as a sole basis for treatment. Nasal washings and aspirates are unacceptable for Xpert Xpress SARS-CoV-2/FLU/RSV testing.  Fact Sheet for Patients: EntrepreneurPulse.com.au  Fact Sheet for Healthcare Providers: IncredibleEmployment.be  This test is not yet approved or cleared by the [place] FDA and has been authorized for detection and/or diagnosis of SARS-CoV-2 by FDA under an Emergency Use Authorization (EUA). This EUA will remain in effect (meaning this test can be used) for the duration of the COVID-19 declaration under Section 564(b)(1) of the Act, 21 U.S.C. section 360bbb-3(b)(1), unless the authorization is terminated  or revoked.     Resp Syncytial Virus by PCR NEGATIVE NEGATIVE Final    Comment: (NOTE) Fact Sheet for Patients: EntrepreneurPulse.com.au  Fact Sheet for Healthcare Providers: IncredibleEmployment.be  This test is not yet approved or cleared by the [place] FDA and has been authorized for detection and/or diagnosis of SARS-CoV-2 by FDA under an Emergency Use Authorization (EUA). This EUA will remain in effect (meaning this test can be used) for the duration of the COVID-19 declaration under Section 564(b)(1) of the Act, 21 U.S.C. section 360bbb-3(b)(1), unless the authorization is terminated or revoked.  Performed at [facility], [address] [place], [place] [address]   Blood culture (routine x 2)     Status: None (Preliminary result)   Collection Time: 10/26/22  1:26 PM   Specimen: BLOOD  Result Value Ref Range Status   Specimen Description   Final    BLOOD RIGHT ANTECUBITAL Performed at [facility], [address] [place], [place] [address]    Special Requests   Final    BOTTLES DRAWN AEROBIC AND ANAEROBIC Blood Culture results may not be optimal due to an inadequate volume of blood received in culture bottles Performed at [facility], [address] [place], [place] [address]    Culture   Final    NO GROWTH 3 DAYS Performed at [facility], [place] [address] [place], [place] [address]    Report Status PENDING  Incomplete  Blood culture (routine x 2)     Status: None (Preliminary result)   Collection Time: 10/26/22  1:47 PM   Specimen: BLOOD  Result Value Ref Range Status   Specimen Description   Final    BLOOD LEFT ANTECUBITAL Performed at [facility], [address] [place], [place] [address]    Special Requests   Final    BOTTLES DRAWN AEROBIC AND ANAEROBIC Blood Culture results may not be optimal due to an inadequate volume of blood received in culture  bottles Performed at [facility], [address] [place], [place] [address]    Culture   Final    NO GROWTH 3 DAYS Performed at [facility], [place] [address], [place], [place] [address]    Report Status PENDING  Incomplete  Urine Culture (for pregnant,  neutropenic or urologic patients or patients with an indwelling urinary catheter)     Status: None   Collection Time: 10/26/22  4:15 PM   Specimen: Urine, Clean Catch  Result Value Ref Range Status   Specimen Description   Final    URINE, CLEAN CATCH Performed at [facility], [address] [place], [place] [address]    Special Requests   Final    NONE Performed at [facility], [address] [place], [place] [address]    Culture   Final    NO GROWTH Performed at [facility], [place] [address] [place], [place] [address]    Report Status 10/28/2022 FINAL  Final  Respiratory (~20 pathogens) panel by PCR     Status: None   Collection Time: 10/27/22  6:21 AM   Specimen: Nasopharyngeal Swab; Respiratory  Result Value Ref Range Status   Adenovirus NOT DETECTED NOT DETECTED Final   Coronavirus 229E NOT DETECTED NOT DETECTED Final    Comment: (NOTE) The Coronavirus on the Respiratory Panel, DOES NOT test for the novel  Coronavirus (2019 nCoV)    Coronavirus HKU1 NOT DETECTED NOT DETECTED Final   Coronavirus NL63 NOT DETECTED NOT DETECTED Final   Coronavirus OC43 NOT DETECTED NOT DETECTED Final   Metapneumovirus NOT DETECTED NOT DETECTED Final   Rhinovirus / Enterovirus NOT DETECTED NOT DETECTED Final   Influenza A NOT DETECTED NOT DETECTED Final   Influenza B NOT DETECTED NOT DETECTED Final   Parainfluenza Virus 1 NOT DETECTED NOT DETECTED Final   Parainfluenza Virus 2 NOT DETECTED NOT DETECTED Final   Parainfluenza Virus 3 NOT DETECTED NOT DETECTED Final   Parainfluenza Virus 4 NOT DETECTED NOT DETECTED Final   Respiratory Syncytial Virus NOT DETECTED NOT DETECTED Final    Bordetella pertussis NOT DETECTED NOT DETECTED Final   Bordetella Parapertussis NOT DETECTED NOT DETECTED Final   Chlamydophila pneumoniae NOT DETECTED NOT DETECTED Final   Mycoplasma pneumoniae NOT DETECTED NOT DETECTED Final    Comment: Performed at [facility], [place], [place], [place] [address]  Expectorated Sputum Assessment w Gram Stain, Rflx to Resp Cult     Status: None   Collection Time: 10/27/22 10:00 AM   Specimen: Expectorated Sputum  Result Value Ref Range Status   Specimen Description EXPECTORATED SPUTUM  Final   Special Requests NONE  Final   Sputum evaluation   Final    Sputum specimen not acceptable for testing.  Please recollect.   NOTIFIED STEPHANIE RN FOR RECOLLECT 10/28/22 AT 1211 BY MG Performed at [facility], [place] [address] [place], [place] [address]    Report Status 10/28/2022 FINAL  Final     Labs: Basic Metabolic Panel: Recent Labs  Lab 10/26/22 1123 10/27/22 0426 10/28/22 0332 10/29/22 0329  NA 133* 136 137 137  K 5.4* 3.8 3.9 4.2  CL 100 104 106 106  CO2 22 24 23 22   GLUCOSE 137* 184* 157* 162*  BUN 18 14 16 15   CREATININE 1.10 0.84 0.89 0.88  CALCIUM 9.0 8.1* 8.0* 8.2*  MG  --  2.2 2.5* 2.6*  PHOS  --  2.8 2.6 4.0   Liver Function Tests: Recent Labs  Lab 10/26/22 1123 10/27/22 0426 10/28/22 0332 10/29/22 0329  AST 28 24 24 23   ALT 45* 34 31 32  ALKPHOS 63 49 40 37*  BILITOT 1.3* 1.0 0.6 0.6  PROT 8.0 6.5 6.1* 6.2*  ALBUMIN 4.6  3.7 3.6 3.7   No results for input(s): "LIPASE", "AMYLASE" in the last 168 hours. No results for input(s): "AMMONIA" in the last 168 hours. CBC: Recent Labs  Lab 10/26/22 1123 10/27/22 0426 10/28/22 0332 10/29/22 0329  WBC 14.6* 6.4 8.5 6.6  NEUTROABS 13.6* 5.9 6.9 4.9  HGB 16.2 14.6 13.6 14.2  HCT 48.6 43.4 41.3 42.2  MCV 92.9 96.0 95.8 94.6  PLT 250 201 218 166   Cardiac Enzymes: No results for input(s): "CKTOTAL", "CKMB", "CKMBINDEX", "TROPONINI" in the last  168 hours. BNP: BNP (last 3 results) No results for input(s): "BNP" in the last 8760 hours.  ProBNP (last 3 results) No results for input(s): "PROBNP" in the last 8760 hours.  CBG: Recent Labs  Lab 10/26/22 1106  GLUCAP 123*       Signed:  Dia Crawford, MD [facility]

## 2022-10-31 LAB — CULTURE, BLOOD (ROUTINE X 2)
Culture: NO GROWTH
Culture: NO GROWTH

## 2022-10-31 LAB — LEGIONELLA PNEUMOPHILA SEROGP 1 UR AG: L. pneumophila Serogp 1 Ur Ag: NEGATIVE
